# Patient Record
Sex: Female | Born: 1960 | Race: White | Hispanic: No | Smoking: Former smoker
Health system: Southern US, Community
[De-identification: ages and names within clinical notes are randomized; demographics above are authoritative.]

## PROBLEM LIST (undated history)

## (undated) DIAGNOSIS — K219 Gastro-esophageal reflux disease without esophagitis: Secondary | ICD-10-CM

## (undated) DIAGNOSIS — T4145XA Adverse effect of unspecified anesthetic, initial encounter: Secondary | ICD-10-CM

## (undated) DIAGNOSIS — Z9889 Other specified postprocedural states: Secondary | ICD-10-CM

## (undated) DIAGNOSIS — I2510 Atherosclerotic heart disease of native coronary artery without angina pectoris: Secondary | ICD-10-CM

## (undated) DIAGNOSIS — T8859XA Other complications of anesthesia, initial encounter: Secondary | ICD-10-CM

## (undated) DIAGNOSIS — R112 Nausea with vomiting, unspecified: Secondary | ICD-10-CM

## (undated) DIAGNOSIS — E781 Pure hyperglyceridemia: Secondary | ICD-10-CM

## (undated) HISTORY — PX: CHOLECYSTECTOMY: SHX55

## (undated) HISTORY — DX: Atherosclerotic heart disease of native coronary artery without angina pectoris: I25.10

## (undated) HISTORY — DX: Pure hyperglyceridemia: E78.1

## (undated) HISTORY — PX: TONSILLECTOMY: SUR1361

## (undated) HISTORY — PX: PLACEMENT OF BREAST IMPLANTS: SHX6334

---

## 1983-10-23 HISTORY — PX: WRIST FUSION: SHX839

## 1996-10-22 HISTORY — PX: GASTRIC BYPASS: SHX52

## 1999-10-23 HISTORY — PX: MANDIBLE FRACTURE SURGERY: SHX706

## 2003-10-23 HISTORY — PX: ABDOMINOPLASTY/PANNICULECTOMY: SHX5578

## 2018-05-01 ENCOUNTER — Ambulatory Visit: Payer: Self-pay

## 2018-05-01 ENCOUNTER — Other Ambulatory Visit: Payer: Self-pay | Admitting: Occupational Medicine

## 2018-05-01 DIAGNOSIS — M545 Low back pain: Secondary | ICD-10-CM

## 2019-02-25 NOTE — H&P (Signed)
TOTAL HIP ADMISSION H&P  Patient is admitted for right total hip arthroplasty, anterior approach.  Subjective:  Chief Complaint:   Right hip primary OA / pain  HPI: Jade Strickland, 58 y.o. female, has a history of pain and functional disability in the right hip(s) due to arthritis and patient has failed non-surgical conservative treatments for greater than 12 weeks to include NSAID's and/or analgesics and activity modification.  Onset of symptoms was abrupt starting ~1 year ago with gradually worsening course since that time.The patient noted no past surgery on the right hip(s).  Patient currently rates pain in the right hip at 8 out of 10 with activity. Patient has night pain, worsening of pain with activity and weight bearing, trendelenberg gait, pain that interfers with activities of daily living and pain with passive range of motion. Patient has evidence of periarticular osteophytes and joint space narrowing by imaging studies. This condition presents safety issues increasing the risk of falls. There is no current active infection.  Risks, benefits and expectations were discussed with the patient.  Risks including but not limited to the risk of anesthesia, blood clots, nerve damage, blood vessel damage, failure of the prosthesis, infection and up to and including death.  Patient understand the risks, benefits and expectations and wishes to proceed with surgery.   D/C Plans:       Home   Post-op Meds:       No Rx given  Tranexamic Acid:      To be given - IV   Decadron:      Is to be given  FYI:      ASA  Oxycodone (can't tolerate Norco)  No celebrex (sulfa allergy)  DME:   Pt already has equipment / Rx given for - RW   PT:    No PT    Past Medical History:  Diagnosis Date  . Complication of anesthesia   . PONV (postoperative nausea and vomiting)     Past Surgical History:  Procedure Laterality Date  . ABDOMINOPLASTY/PANNICULECTOMY  2005  . CHOLECYSTECTOMY    . GASTRIC BYPASS   1998  . MANDIBLE FRACTURE SURGERY  2001   upper  . TONSILLECTOMY    . WRIST FUSION Left 1985    No current facility-administered medications for this encounter.    Current Outpatient Medications  Medication Sig Dispense Refill Last Dose  . ibuprofen (ADVIL) 200 MG tablet Take 800 mg by mouth daily as needed for headache or moderate pain.     Marland Kitchen. lansoprazole (PREVACID) 15 MG capsule Take 15 mg by mouth daily at 12 noon.     . methocarbamol (ROBAXIN) 500 MG tablet Take 500 mg by mouth every 8 (eight) hours as needed for muscle spasms.     Marland Kitchen. oxymetazoline (AFRIN) 0.05 % nasal spray Place 1 spray into both nostrils 2 (two) times daily as needed for congestion.     . Simethicone (GAS-X PO) Take 1 tablet by mouth daily as needed (gas).     . traMADol (ULTRAM) 50 MG tablet Take 50 mg by mouth every 12 (twelve) hours as needed for severe pain.      Allergies  Allergen Reactions  . Hydrocodone Nausea And Vomiting  . Sulfa Antibiotics Hives    Social History   Tobacco Use  . Smoking status: Never Smoker  . Smokeless tobacco: Never Used  Substance Use Topics  . Alcohol use: Yes    Comment: Rarely      Review of Systems  Constitutional:  Negative.   HENT: Negative.   Eyes: Negative.   Respiratory: Negative.   Cardiovascular: Negative.   Gastrointestinal: Negative.   Genitourinary: Negative.   Musculoskeletal: Positive for joint pain.  Skin: Negative.   Neurological: Negative.   Endo/Heme/Allergies: Negative.   Psychiatric/Behavioral: Negative.     Objective:  Physical Exam  Constitutional: She is oriented to person, place, and time. She appears well-developed.  HENT:  Head: Normocephalic.  Eyes: Pupils are equal, round, and reactive to light.  Neck: Neck supple. No JVD present. No tracheal deviation present. No thyromegaly present.  Cardiovascular: Normal rate, regular rhythm and intact distal pulses.  Respiratory: Effort normal and breath sounds normal. No respiratory  distress. She has no wheezes.  GI: Soft. There is no abdominal tenderness. There is no guarding.  Musculoskeletal:     Right hip: She exhibits decreased range of motion, decreased strength, tenderness and bony tenderness. She exhibits no swelling, no deformity and no laceration.  Lymphadenopathy:    She has no cervical adenopathy.  Neurological: She is alert and oriented to person, place, and time.  Skin: Skin is warm and dry.  Psychiatric: She has a normal mood and affect.      Imaging Review Plain radiographs demonstrate severe degenerative joint disease of the right hip. The bone quality appears to be good for age and reported activity level.    Assessment/Plan:  End stage arthritis, right hip  The patient history, physical examination, clinical judgement of the provider and imaging studies are consistent with end stage degenerative joint disease of the right hip and total hip arthroplasty is deemed medically necessary. The treatment options including medical management, injection therapy, arthroscopy and arthroplasty were discussed at length. The risks and benefits of total hip arthroplasty were presented and reviewed. The risks due to aseptic loosening, infection, stiffness, dislocation/subluxation,  thromboembolic complications and other imponderables were discussed.  The patient acknowledged the explanation, agreed to proceed with the plan and consent was signed. Patient is being admitted for inpatient treatment for surgery, pain control, PT, OT, prophylactic antibiotics, VTE prophylaxis, progressive ambulation and ADL's and discharge planning.The patient is planning to be discharged home.     Anastasio Auerbach Braydyn Schultes   PA-C  03/02/2019, 7:27 AM

## 2019-02-26 NOTE — Progress Notes (Signed)
SPOKE W/PT     SCREENING SYMPTOMS OF COVID 19:   COUGH--NO  RUNNY NOSE--- NO  SORE THROAT---NO  NASAL CONGESTION----NO  SNEEZING----NO  SHORTNESS OF BREATH---NO  DIFFICULTY BREATHING---NO  TEMP >100.0 -----NO  UNEXPLAINED BODY ACHES------NO  CHILLS --------NO  HEADACHES ---------NO  LOSS OF SMELL/ TASTE --------NO    HAVE YOU OR ANY FAMILY MEMBER TRAVELLED PAST 14 DAYS OUT OF THE   COUNTY---NO STATE----NO COUNTRY----NO  HAVE YOU OR ANY FAMILY MEMBER BEEN EXPOSED TO ANYONE WITH COVID 19? NO    

## 2019-02-26 NOTE — Progress Notes (Signed)
LVM for COVID 19 Screening... Awaiting return call.

## 2019-02-26 NOTE — Patient Instructions (Addendum)
Jade Strickland  02/26/2019   Your procedure is scheduled on: 03-03-19    Report to [facility] Main  Entrance    Report to Admitting at 6:25 AM    Call this number if you have problems the morning of surgery [phone]    Remember: NO SOLID FOOD AFTER MIDNIGHT THE NIGHT PRIOR TO SURGERY. NOTHING BY MOUTH EXCEPT CLEAR LIQUIDS UNTIL 3 HOURS PRIOR TO SCHEUDLED SURGERY. PLEASE FINISH ENSURE DRINK PER SURGEON ORDER, WHICH NEEDS TO BE COMPLETED AT 4:30 AM.   CLEAR LIQUID DIET   Foods Allowed                                                                     Foods Excluded  Coffee and tea, regular and decaf                             liquids that you cannot  Plain Jell-O in any flavor                                             see through such as: Fruit ices (not with fruit pulp)                                     milk, soups, orange juice  Iced Popsicles                                    All solid food Carbonated beverages, regular and diet                                    Cranberry, grape and apple juices Sports drinks like Gatorade Lightly seasoned clear broth or consume(fat free) Sugar, honey syrup  Sample Menu Breakfast                                Lunch                                     Supper Cranberry juice                    Beef broth                            Chicken broth Jell-O                                     Grape juice  Apple juice Coffee or tea                        Jell-O                                      Popsicle                                                Coffee or tea                        Coffee or tea  _____________________________________________________________________     BRUSH YOUR TEETH MORNING OF SURGERY AND RINSE YOUR MOUTH OUT, NO CHEWING GUM CANDY OR MINTS.     Take these medicines the morning of surgery with A SIP OF WATER: None. You may bring and use your nasal spray as  needed.                                 You may not have any metal on your body including hair pins and              piercings  Do not wear jewelry, make-up, lotions, powders or perfumes, deodorant             Do not wear nail polish.  Do not shave  48 hours prior to surgery.                 Do not bring valuables to the hospital. [facility] IS NOT             RESPONSIBLE   FOR VALUABLES.  Contacts, dentures or bridgework may not be worn into surgery.      Special Instructions: N/A              Please read over the following fact sheets you were given: _____________________________________________________________________             [facility] Pre[facility] Before surgery, you can play an important role.  Because skin is not sterile, your skin needs to be as free of germs as possible.  You can reduce the number of germs on your skin by washing with CHG (chlorahexidine gluconate) soap before surgery.  CHG is an antiseptic cleaner which kills germs and bonds with the skin to continue killing germs even after washing. Please DO NOT use if you have an allergy to CHG or antibacterial soaps.  If your skin becomes reddened/irritated stop using the CHG and inform your nurse when you arrive at Short Stay. Do not shave (including legs and underarms) for at least 48 hours prior to the first CHG shower.  You may shave your face/neck. Please follow these instructions carefully:  1.  Shower with CHG Soap the night before surgery and the  morning of Surgery.  2.  If you choose to wash your hair, wash your hair first as usual with your  normal  shampoo.  3.  After you shampoo, rinse your hair and body thoroughly to remove the  shampoo.  4.  Use CHG as you would any other liquid soap.  You can apply chg directly  to the skin and wash                       Gently with a scrungie or clean washcloth.  5.  Apply the CHG Soap to your body ONLY FROM THE NECK DOWN.   Do not  use on face/ open                           Wound or open sores. Avoid contact with eyes, ears mouth and genitals (private parts).                       Wash face,  Genitals (private parts) with your normal soap.             6.  Wash thoroughly, paying special attention to the area where your surgery  will be performed.  7.  Thoroughly rinse your body with warm water from the neck down.  8.  DO NOT shower/wash with your normal soap after using and rinsing off  the CHG Soap.                9.  Pat yourself dry with a clean towel.            10.  Wear clean pajamas.            11.  Place clean sheets on your bed the night of your first shower and do not  sleep with pets. Day of Surgery : Do not apply any lotions/deodorants the morning of surgery.  Please wear clean clothes to the hospital/surgery center.  FAILURE TO FOLLOW THESE INSTRUCTIONS MAY RESULT IN THE CANCELLATION OF YOUR SURGERY PATIENT SIGNATURE_________________________________  NURSE SIGNATURE__________________________________  ________________________________________________________________________   Jade Strickland  An incentive spirometer is a tool that can help keep your lungs clear and active. This tool measures how well you are filling your lungs with each breath. Taking long deep breaths may help reverse or decrease the chance of developing breathing (pulmonary) problems (especially infection) following:  A long period of time when you are unable to move or be active. BEFORE THE PROCEDURE   If the spirometer includes an indicator to show your best effort, your nurse or respiratory therapist will set it to a desired goal.  If possible, sit up straight or lean slightly forward. Try not to slouch.  Hold the incentive spirometer in an upright position. INSTRUCTIONS FOR USE  1. Sit on the edge of your bed if possible, or sit up as far as you can in bed or on a chair. 2. Hold the incentive spirometer in an upright  position. 3. Breathe out normally. 4. Place the mouthpiece in your mouth and seal your lips tightly around it. 5. Breathe in slowly and as deeply as possible, raising the piston or the ball toward the top of the column. 6. Hold your breath for 3-5 seconds or for as long as possible. Allow the piston or ball to fall to the bottom of the column. 7. Remove the mouthpiece from your mouth and breathe out normally. 8. Rest for a few seconds and repeat Steps 1 through 7 at least 10 times every 1-2 hours when you are awake. Take your time and take a few normal breaths between deep breaths. 9. The spirometer may include an indicator to  show your best effort. Use the indicator as a goal to work toward during each repetition. 10. After each set of 10 deep breaths, practice coughing to be sure your lungs are clear. If you have an incision (the cut made at the time of surgery), support your incision when coughing by placing a pillow or rolled up towels firmly against it. Once you are able to get out of bed, walk around indoors and cough well. You may stop using the incentive spirometer when instructed by your caregiver.  RISKS AND COMPLICATIONS  Take your time so you do not get dizzy or light-headed.  If you are in pain, you may need to take or ask for pain medication before doing incentive spirometry. It is harder to take a deep breath if you are having pain. AFTER USE  Rest and breathe slowly and easily.  It can be helpful to keep track of a log of your progress. Your caregiver can provide you with a simple table to help with this. If you are using the spirometer at home, follow these instructions: [place] IF:   You are having difficultly using the spirometer.  You have trouble using the spirometer as often as instructed.  Your pain medication is not giving enough relief while using the spirometer.  You develop fever of 100.5 F (38.1 C) or higher. SEEK IMMEDIATE MEDICAL CARE IF:    You cough up bloody sputum that had not been present before.  You develop fever of 102 F (38.9 C) or greater.  You develop worsening pain at or near the incision site. MAKE SURE YOU:   Understand these instructions.  Will watch your condition.  Will get help right away if you are not doing well or get worse. Document Released: 02/18/2007 Document Revised: 12/31/2011 Document Reviewed: 04/21/2007 Exit[employer] Patient Information 2014 [employer], [place].   ________________________________________________________________________  WHAT IS A BLOOD TRANSFUSION? Blood Transfusion Information  A transfusion is the replacement of blood or some of its parts. Blood is made up of multiple cells which provide different functions.  Red blood cells carry oxygen and are used for blood loss replacement.  White blood cells fight against infection.  Platelets control bleeding.  Plasma helps clot blood.  Other blood products are available for specialized needs, such as hemophilia or other clotting disorders. BEFORE THE TRANSFUSION  Who gives blood for transfusions?   Healthy volunteers who are fully evaluated to make sure their blood is safe. This is blood bank blood. Transfusion therapy is the safest it has ever been in the practice of medicine. Before blood is taken from a donor, a complete history is taken to make sure that person has no history of diseases nor engages in risky social behavior (examples are intravenous drug use or sexual activity with multiple partners). The donor's travel history is screened to minimize risk of transmitting infections, such as malaria. The donated blood is tested for signs of infectious diseases, such as HIV and hepatitis. The blood is then tested to be sure it is compatible with you in order to minimize the chance of a transfusion reaction. If you or a relative donates blood, this is often done in anticipation of surgery and is not appropriate for emergency  situations. It takes many days to process the donated blood. RISKS AND COMPLICATIONS Although transfusion therapy is very safe and saves many lives, the main dangers of transfusion include:   Getting an infectious disease.  Developing a transfusion reaction. This is an allergic reaction  to something in the blood you were given. Every precaution is taken to prevent this. The decision to have a blood transfusion has been considered carefully by your caregiver before blood is given. Blood is not given unless the benefits outweigh the risks. AFTER THE TRANSFUSION  Right after receiving a blood transfusion, you will usually feel much better and more energetic. This is especially true if your red blood cells have gotten low (anemic). The transfusion raises the level of the red blood cells which carry oxygen, and this usually causes an energy increase.  The nurse administering the transfusion will monitor you carefully for complications. HOME CARE INSTRUCTIONS  No special instructions are needed after a transfusion. You may find your energy is better. Speak with your caregiver about any limitations on activity for underlying diseases you may have. SEEK MEDICAL CARE IF:   Your condition is not improving after your transfusion.  You develop redness or irritation at the intravenous (IV) site. SEEK IMMEDIATE MEDICAL CARE IF:  Any of the following symptoms occur over the next 12 hours:  Shaking chills.  You have a temperature by mouth above 102 F (38.9 C), not controlled by medicine.  Chest, back, or muscle pain.  People around you feel you are not acting correctly or are confused.  Shortness of breath or difficulty breathing.  Dizziness and fainting.  You get a rash or develop hives.  You have a decrease in urine output.  Your urine turns a dark color or changes to pink, red, or brown. Any of the following symptoms occur over the next 10 days:  You have a temperature by mouth above  102 F (38.9 C), not controlled by medicine.  Shortness of breath.  Weakness after normal activity.  The white part of the eye turns yellow (jaundice).  You have a decrease in the amount of urine or are urinating less often.  Your urine turns a dark color or changes to pink, red, or brown. Document Released: 10/05/2000 Document Revised: 12/31/2011 Document Reviewed: 05/24/2008 [facility] Patient Information 2014 [place], [place].  _______________________________________________________________________

## 2019-02-27 ENCOUNTER — Other Ambulatory Visit: Payer: Self-pay

## 2019-02-27 ENCOUNTER — Other Ambulatory Visit (HOSPITAL_COMMUNITY)
Admission: RE | Admit: 2019-02-27 | Discharge: 2019-02-27 | Disposition: A | Discharge status: Home / Self Care | Payer: No Typology Code available for payment source | Source: Ambulatory Visit | Attending: Orthopedic Surgery | Admitting: Orthopedic Surgery

## 2019-02-27 ENCOUNTER — Encounter (HOSPITAL_COMMUNITY)
Admission: RE | Admit: 2019-02-27 | Discharge: 2019-02-27 | Disposition: A | Discharge status: Home / Self Care | Payer: No Typology Code available for payment source | Source: Ambulatory Visit | Attending: Orthopedic Surgery | Admitting: Orthopedic Surgery

## 2019-02-27 ENCOUNTER — Encounter (HOSPITAL_COMMUNITY): Payer: Self-pay

## 2019-02-27 DIAGNOSIS — M1611 Unilateral primary osteoarthritis, right hip: Secondary | ICD-10-CM | POA: Insufficient documentation

## 2019-02-27 DIAGNOSIS — Z01812 Encounter for preprocedural laboratory examination: Secondary | ICD-10-CM | POA: Insufficient documentation

## 2019-02-27 DIAGNOSIS — Z1159 Encounter for screening for other viral diseases: Secondary | ICD-10-CM | POA: Insufficient documentation

## 2019-02-27 HISTORY — DX: Adverse effect of unspecified anesthetic, initial encounter: T41.45XA

## 2019-02-27 HISTORY — DX: Other specified postprocedural states: R11.2

## 2019-02-27 HISTORY — DX: Other specified postprocedural states: Z98.890

## 2019-02-27 HISTORY — DX: Other complications of anesthesia, initial encounter: T88.59XA

## 2019-02-27 LAB — BASIC METABOLIC PANEL
Anion gap: 8 (ref 5–15)
BUN: 18 mg/dL (ref 6–20)
CO2: 25 mmol/L (ref 22–32)
Calcium: 9.1 mg/dL (ref 8.9–10.3)
Chloride: 106 mmol/L (ref 98–111)
Creatinine, Ser: 0.66 mg/dL (ref 0.44–1.00)
GFR calc Af Amer: >60 mL/min (ref >60)
GFR calc non Af Amer: >60 mL/min (ref >60)
Glucose, Bld: 97 mg/dL (ref 70–99)
Potassium: 4.1 mmol/L (ref 3.5–5.1)
Sodium: 139 mmol/L (ref 135–145)

## 2019-02-27 LAB — CBC
HCT: 40.3 % (ref 36.0–46.0)
Hemoglobin: 12.5 g/dL (ref 12.0–15.0)
MCH: 28.7 pg (ref 26.0–34.0)
MCHC: 31 g/dL (ref 30.0–36.0)
MCV: 92.6 fL (ref 80.0–100.0)
Platelets: 258 10*3/uL (ref 150–400)
RBC: 4.35 MIL/uL (ref 3.87–5.11)
RDW: 12.8 % (ref 11.5–15.5)
WBC: 6.3 10*3/uL (ref 4.0–10.5)
nRBC: 0 % (ref 0.0–0.2)

## 2019-02-27 LAB — SURGICAL PCR SCREEN
MRSA, PCR: NEGATIVE
Staphylococcus aureus: POSITIVE — AB

## 2019-02-28 LAB — ABO/RH: ABO/RH(D): B POS

## 2019-03-01 LAB — NOVEL CORONAVIRUS, NAA (HOSP ORDER, SEND-OUT TO REF LAB; TAT 18-24 HRS): SARS-CoV-2, NAA: NOT DETECTED

## 2019-03-02 ENCOUNTER — Other Ambulatory Visit: Payer: Self-pay

## 2019-03-02 NOTE — Progress Notes (Signed)
Anesthesia Chart Review   Case:  [id number] Date/Time:  03/03/19 0840   Procedure:  TOTAL HIP ARTHROPLASTY ANTERIOR APPROACH (Right ) - 70 mins   Anesthesia type:  Spinal   Pre-op diagnosis:  Right hip osteoarthritis   Location:  [facility] ROOM 10 / [facility]S   Surgeon:  Durene Romans, MD      DISCUSSION: 58 yo PONV, right hip OA scheduled for above procedure 03/03/19 with Dr. Durene Romans.   Negative COVID19 swab 02/27/19.   Pt can proceed with planned procedure barring acute status change.  VS: BP 138/76   Pulse 75   Temp 36.6 C (Oral)   Resp 16   Ht 5\' 4"  (1.626 m)   Wt 68.8 kg   LMP 02/27/2008   SpO2 99%   BMI 26.04 kg/m   PROVIDERS: Patient, No Pcp Per   LABS: Labs reviewed: Acceptable for surgery. (all labs ordered are listed, but only abnormal results are displayed)  Labs Reviewed  SURGICAL PCR SCREEN - Abnormal; Notable for the following components:      Result Value   Staphylococcus aureus POSITIVE (*)    All other components within normal limits  BASIC METABOLIC PANEL  CBC  TYPE AND SCREEN  ABO/RH     IMAGES:   EKG:   CV:  Past Medical History:  Diagnosis Date  . Complication of anesthesia   . PONV (postoperative nausea and vomiting)     Past Surgical History:  Procedure Laterality Date  . ABDOMINOPLASTY/PANNICULECTOMY  2005  . CHOLECYSTECTOMY    . GASTRIC BYPASS  1998  . MANDIBLE FRACTURE SURGERY  2001   upper  . TONSILLECTOMY    . WRIST FUSION Left 1985    MEDICATIONS: . ibuprofen (ADVIL) 200 MG tablet  . lansoprazole (PREVACID) 15 MG capsule  . methocarbamol (ROBAXIN) 500 MG tablet  . oxymetazoline (AFRIN) 0.05 % nasal spray  . Simethicone (GAS-X PO)  . traMADol (ULTRAM) 50 MG tablet   No current facility-administered medications for this encounter.     Janey Genta [facility] Pre-Surgical Testing [phone] 03/02/19 1:29 PM

## 2019-03-02 NOTE — Progress Notes (Signed)
02-27-19 PCR result routed to Dr. Charlann Boxer for review

## 2019-03-02 NOTE — Progress Notes (Signed)
SPOKE W/  Jade Strickland     SCREENING SYMPTOMS OF COVID 19:   COUGH--no  RUNNY NOSE--- no  SORE THROAT---no  NASAL CONGESTION----no  SNEEZING----no  SHORTNESS OF BREATH---no  DIFFICULTY BREATHING---no  TEMP >100.0 -----no  UNEXPLAINED BODY ACHES------no  CHILLS -------- no  HEADACHES ---------no  LOSS OF SMELL/ TASTE --------no    HAVE YOU OR ANY FAMILY MEMBER TRAVELLED PAST 14 DAYS OUT OF THE   COUNTY---no STATE----no COUNTRY----no  HAVE YOU OR ANY FAMILY MEMBER BEEN EXPOSED TO ANYONE WITH COVID 19? no

## 2019-03-03 ENCOUNTER — Inpatient Hospital Stay (HOSPITAL_COMMUNITY): Payer: Self-pay | Admitting: Physician Assistant

## 2019-03-03 ENCOUNTER — Encounter (HOSPITAL_COMMUNITY): Payer: Self-pay | Admitting: Registered Nurse

## 2019-03-03 ENCOUNTER — Inpatient Hospital Stay (HOSPITAL_COMMUNITY): Payer: Self-pay

## 2019-03-03 ENCOUNTER — Observation Stay (HOSPITAL_COMMUNITY): Payer: Self-pay

## 2019-03-03 ENCOUNTER — Inpatient Hospital Stay (HOSPITAL_COMMUNITY): Payer: Self-pay | Admitting: Anesthesiology

## 2019-03-03 ENCOUNTER — Observation Stay (HOSPITAL_COMMUNITY)
Admission: RE | Admit: 2019-03-03 | Discharge: 2019-03-04 | Disposition: A | Discharge status: Home / Self Care | Payer: Self-pay | Attending: Orthopedic Surgery | Admitting: Orthopedic Surgery

## 2019-03-03 ENCOUNTER — Encounter (HOSPITAL_COMMUNITY)
Admission: RE | Disposition: A | Discharge status: Home / Self Care | Payer: Self-pay | Source: Home / Self Care | Attending: Orthopedic Surgery

## 2019-03-03 DIAGNOSIS — Z79899 Other long term (current) drug therapy: Secondary | ICD-10-CM | POA: Insufficient documentation

## 2019-03-03 DIAGNOSIS — Z9884 Bariatric surgery status: Secondary | ICD-10-CM | POA: Insufficient documentation

## 2019-03-03 DIAGNOSIS — Z96649 Presence of unspecified artificial hip joint: Secondary | ICD-10-CM

## 2019-03-03 DIAGNOSIS — Z96641 Presence of right artificial hip joint: Secondary | ICD-10-CM

## 2019-03-03 DIAGNOSIS — Z419 Encounter for procedure for purposes other than remedying health state, unspecified: Secondary | ICD-10-CM

## 2019-03-03 DIAGNOSIS — M160 Bilateral primary osteoarthritis of hip: Principal | ICD-10-CM | POA: Insufficient documentation

## 2019-03-03 HISTORY — PX: TOTAL HIP ARTHROPLASTY: SHX124

## 2019-03-03 LAB — TYPE AND SCREEN
ABO/RH(D): B POS
Antibody Screen: NEGATIVE

## 2019-03-03 SURGERY — ARTHROPLASTY, HIP, TOTAL, ANTERIOR APPROACH
Anesthesia: Spinal | Site: Hip

## 2019-03-03 MED ORDER — LIDOCAINE HCL (PF) 1 % IJ SOLN
INTRAMUSCULAR | Status: AC
  Filled 2019-03-03: qty 30

## 2019-03-03 MED ORDER — PANTOPRAZOLE SODIUM 20 MG PO TBEC
20.0000 mg | DELAYED_RELEASE_TABLET | Freq: Every day | ORAL | Status: DC
  Administered 2019-03-03: 20 mg via ORAL
  Filled 2019-03-03 (×2): qty 1

## 2019-03-03 MED ORDER — MEPERIDINE HCL 50 MG/ML IJ SOLN: 6.2500 mg | INTRAMUSCULAR | Status: DC | PRN

## 2019-03-03 MED ORDER — MIDAZOLAM HCL 2 MG/2ML IJ SOLN
INTRAMUSCULAR | Status: AC
  Filled 2019-03-03: qty 2

## 2019-03-03 MED ORDER — LACTATED RINGERS IV SOLN
INTRAVENOUS | Status: DC
  Administered 2019-03-03 (×3): via INTRAVENOUS

## 2019-03-03 MED ORDER — METOCLOPRAMIDE HCL 5 MG/ML IJ SOLN: 5.0000 mg | Freq: Three times a day (TID) | INTRAMUSCULAR | Status: DC | PRN

## 2019-03-03 MED ORDER — POLYETHYLENE GLYCOL 3350 17 G PO PACK
17.0000 g | PACK | Freq: Two times a day (BID) | ORAL | Status: DC
  Administered 2019-03-03: 17 g via ORAL
  Filled 2019-03-03: qty 1

## 2019-03-03 MED ORDER — 0.9 % SODIUM CHLORIDE (POUR BTL) OPTIME
TOPICAL | Status: DC | PRN
  Administered 2019-03-03: 1000 mL

## 2019-03-03 MED ORDER — LACTATED RINGERS IV SOLN: INTRAVENOUS | Status: DC

## 2019-03-03 MED ORDER — METHOCARBAMOL 500 MG PO TABS: 500.0000 mg | ORAL_TABLET | Freq: Four times a day (QID) | ORAL | 0 refills | Status: DC | PRN

## 2019-03-03 MED ORDER — CEFAZOLIN SODIUM-DEXTROSE 2-4 GM/100ML-% IV SOLN
2.0000 g | INTRAVENOUS | Status: AC
  Administered 2019-03-03: 2 g via INTRAVENOUS
  Filled 2019-03-03: qty 100

## 2019-03-03 MED ORDER — MAGNESIUM CITRATE PO SOLN: 1.0000 | Freq: Once | ORAL | Status: DC | PRN

## 2019-03-03 MED ORDER — METHOCARBAMOL 500 MG PO TABS
500.0000 mg | ORAL_TABLET | Freq: Four times a day (QID) | ORAL | Status: DC | PRN
  Administered 2019-03-03 – 2019-03-04 (×2): 500 mg via ORAL
  Filled 2019-03-03 (×2): qty 1

## 2019-03-03 MED ORDER — ACETAMINOPHEN 500 MG PO TABS: 1000.0000 mg | ORAL_TABLET | Freq: Three times a day (TID) | ORAL | 0 refills | Status: DC

## 2019-03-03 MED ORDER — METHYLPREDNISOLONE ACETATE 40 MG/ML IJ SUSP
INTRAMUSCULAR | Status: AC
  Filled 2019-03-03: qty 2

## 2019-03-03 MED ORDER — CEFAZOLIN SODIUM-DEXTROSE 2-4 GM/100ML-% IV SOLN
2.0000 g | Freq: Four times a day (QID) | INTRAVENOUS | Status: AC
  Administered 2019-03-03 (×2): 2 g via INTRAVENOUS
  Filled 2019-03-03 (×2): qty 100

## 2019-03-03 MED ORDER — SODIUM CHLORIDE 0.9 % IV SOLN
INTRAVENOUS | Status: DC
  Administered 2019-03-03 – 2019-03-04 (×2): via INTRAVENOUS

## 2019-03-03 MED ORDER — PROPOFOL 500 MG/50ML IV EMUL
INTRAVENOUS | Status: DC | PRN
  Administered 2019-03-03: 100 ug/kg/min via INTRAVENOUS
  Administered 2019-03-03: 75 ug/kg/min via INTRAVENOUS

## 2019-03-03 MED ORDER — SCOPOLAMINE 1 MG/3DAYS TD PT72
MEDICATED_PATCH | TRANSDERMAL | Status: AC
  Filled 2019-03-03: qty 1

## 2019-03-03 MED ORDER — DEXAMETHASONE SODIUM PHOSPHATE 10 MG/ML IJ SOLN
INTRAMUSCULAR | Status: AC
  Filled 2019-03-03: qty 1

## 2019-03-03 MED ORDER — PHENOL 1.4 % MT LIQD
1.0000 | OROMUCOSAL | Status: DC | PRN
  Filled 2019-03-03: qty 177

## 2019-03-03 MED ORDER — DIPHENHYDRAMINE HCL 12.5 MG/5ML PO ELIX: 12.5000 mg | ORAL_SOLUTION | ORAL | Status: DC | PRN

## 2019-03-03 MED ORDER — PROPOFOL 10 MG/ML IV BOLUS
INTRAVENOUS | Status: DC | PRN
  Administered 2019-03-03: 20 mg via INTRAVENOUS
  Administered 2019-03-03: 50 mg via INTRAVENOUS

## 2019-03-03 MED ORDER — FENTANYL CITRATE (PF) 100 MCG/2ML IJ SOLN: 25.0000 ug | INTRAMUSCULAR | Status: DC | PRN

## 2019-03-03 MED ORDER — POLYETHYLENE GLYCOL 3350 17 G PO PACK: 17.0000 g | PACK | Freq: Two times a day (BID) | ORAL | 0 refills | Status: DC

## 2019-03-03 MED ORDER — FENTANYL CITRATE (PF) 100 MCG/2ML IJ SOLN
INTRAMUSCULAR | Status: AC
  Filled 2019-03-03: qty 2

## 2019-03-03 MED ORDER — ONDANSETRON HCL 4 MG/2ML IJ SOLN
INTRAMUSCULAR | Status: DC | PRN
  Administered 2019-03-03: 4 mg via INTRAVENOUS

## 2019-03-03 MED ORDER — FERROUS SULFATE 325 (65 FE) MG PO TABS
325.0000 mg | ORAL_TABLET | Freq: Three times a day (TID) | ORAL | Status: DC
  Administered 2019-03-03 – 2019-03-04 (×3): 325 mg via ORAL
  Filled 2019-03-03 (×3): qty 1

## 2019-03-03 MED ORDER — OXYCODONE HCL 5 MG PO TABS: 5.0000 mg | ORAL_TABLET | ORAL | Status: DC | PRN

## 2019-03-03 MED ORDER — HYDROMORPHONE HCL 1 MG/ML IJ SOLN
0.5000 mg | INTRAMUSCULAR | Status: DC | PRN
  Administered 2019-03-03 – 2019-03-04 (×4): 1 mg via INTRAVENOUS
  Filled 2019-03-03 (×4): qty 1

## 2019-03-03 MED ORDER — ONDANSETRON HCL 4 MG PO TABS: 4.0000 mg | ORAL_TABLET | Freq: Four times a day (QID) | ORAL | Status: DC | PRN

## 2019-03-03 MED ORDER — PROPOFOL 10 MG/ML IV BOLUS
INTRAVENOUS | Status: AC
  Filled 2019-03-03: qty 20

## 2019-03-03 MED ORDER — BISACODYL 10 MG RE SUPP: 10.0000 mg | Freq: Every day | RECTAL | Status: DC | PRN

## 2019-03-03 MED ORDER — ONDANSETRON HCL 4 MG/2ML IJ SOLN: 4.0000 mg | Freq: Four times a day (QID) | INTRAMUSCULAR | Status: DC | PRN

## 2019-03-03 MED ORDER — METHOCARBAMOL 500 MG IVPB - SIMPLE MED
500.0000 mg | Freq: Four times a day (QID) | INTRAVENOUS | Status: DC | PRN
  Filled 2019-03-03: qty 50

## 2019-03-03 MED ORDER — DEXAMETHASONE SODIUM PHOSPHATE 10 MG/ML IJ SOLN
10.0000 mg | Freq: Once | INTRAMUSCULAR | Status: AC
  Administered 2019-03-04: 10 mg via INTRAVENOUS
  Filled 2019-03-03: qty 1

## 2019-03-03 MED ORDER — METHYLPREDNISOLONE ACETATE 40 MG/ML IJ SUSP
INTRAMUSCULAR | Status: DC | PRN
  Administered 2019-03-03: 80 mg

## 2019-03-03 MED ORDER — ASPIRIN 81 MG PO CHEW: 81.0000 mg | CHEWABLE_TABLET | Freq: Two times a day (BID) | ORAL | 0 refills | Status: AC

## 2019-03-03 MED ORDER — FERROUS SULFATE 325 (65 FE) MG PO TABS: 325.0000 mg | ORAL_TABLET | Freq: Three times a day (TID) | ORAL | 3 refills | Status: DC

## 2019-03-03 MED ORDER — FENTANYL CITRATE (PF) 100 MCG/2ML IJ SOLN
INTRAMUSCULAR | Status: DC | PRN
  Administered 2019-03-03: 100 ug via INTRAVENOUS

## 2019-03-03 MED ORDER — DOCUSATE SODIUM 100 MG PO CAPS
100.0000 mg | ORAL_CAPSULE | Freq: Two times a day (BID) | ORAL | Status: DC
  Administered 2019-03-03 – 2019-03-04 (×2): 100 mg via ORAL
  Filled 2019-03-03 (×2): qty 1

## 2019-03-03 MED ORDER — OXYCODONE HCL 5 MG PO TABS: 5.0000 mg | ORAL_TABLET | ORAL | 0 refills | Status: DC | PRN

## 2019-03-03 MED ORDER — ASPIRIN 81 MG PO CHEW
81.0000 mg | CHEWABLE_TABLET | Freq: Two times a day (BID) | ORAL | Status: DC
  Administered 2019-03-03 – 2019-03-04 (×2): 81 mg via ORAL
  Filled 2019-03-03 (×2): qty 1

## 2019-03-03 MED ORDER — ALUM & MAG HYDROXIDE-SIMETH 200-200-20 MG/5ML PO SUSP: 15.0000 mL | ORAL | Status: DC | PRN

## 2019-03-03 MED ORDER — BUPIVACAINE IN DEXTROSE 0.75-8.25 % IT SOLN
INTRATHECAL | Status: DC | PRN
  Administered 2019-03-03: 1.6 mL via INTRATHECAL

## 2019-03-03 MED ORDER — ONDANSETRON HCL 4 MG/2ML IJ SOLN
INTRAMUSCULAR | Status: AC
  Filled 2019-03-03: qty 2

## 2019-03-03 MED ORDER — OXYCODONE HCL 5 MG PO TABS
10.0000 mg | ORAL_TABLET | ORAL | Status: DC | PRN
  Administered 2019-03-03 – 2019-03-04 (×6): 10 mg via ORAL
  Filled 2019-03-03 (×6): qty 2

## 2019-03-03 MED ORDER — PROPOFOL 10 MG/ML IV BOLUS
INTRAVENOUS | Status: AC
  Filled 2019-03-03: qty 60

## 2019-03-03 MED ORDER — DOCUSATE SODIUM 100 MG PO CAPS: 100.0000 mg | ORAL_CAPSULE | Freq: Two times a day (BID) | ORAL | 0 refills | Status: DC

## 2019-03-03 MED ORDER — SCOPOLAMINE 1 MG/3DAYS TD PT72
MEDICATED_PATCH | TRANSDERMAL | Status: DC | PRN
  Administered 2019-03-03: 1 via TRANSDERMAL

## 2019-03-03 MED ORDER — TRANEXAMIC ACID-NACL 1000-0.7 MG/100ML-% IV SOLN
1000.0000 mg | INTRAVENOUS | Status: AC
  Administered 2019-03-03: 1000 mg via INTRAVENOUS
  Filled 2019-03-03: qty 100

## 2019-03-03 MED ORDER — MENTHOL 3 MG MT LOZG: 1.0000 | LOZENGE | OROMUCOSAL | Status: DC | PRN

## 2019-03-03 MED ORDER — METOCLOPRAMIDE HCL 5 MG PO TABS: 5.0000 mg | ORAL_TABLET | Freq: Three times a day (TID) | ORAL | Status: DC | PRN

## 2019-03-03 MED ORDER — CHLORHEXIDINE GLUCONATE 4 % EX LIQD: 60.0000 mL | Freq: Once | CUTANEOUS | Status: DC

## 2019-03-03 MED ORDER — LIDOCAINE HCL (PF) 1 % IJ SOLN
INTRAMUSCULAR | Status: DC | PRN
  Administered 2019-03-03: 2 mL

## 2019-03-03 MED ORDER — OXYMETAZOLINE HCL 0.05 % NA SOLN
1.0000 | Freq: Two times a day (BID) | NASAL | Status: DC | PRN
  Filled 2019-03-03: qty 15

## 2019-03-03 MED ORDER — SODIUM CHLORIDE 0.9 % IV SOLN
INTRAVENOUS | Status: DC | PRN
  Administered 2019-03-03: 35 ug/min via INTRAVENOUS

## 2019-03-03 MED ORDER — ACETAMINOPHEN 500 MG PO TABS
1000.0000 mg | ORAL_TABLET | Freq: Four times a day (QID) | ORAL | Status: AC
  Administered 2019-03-03 – 2019-03-04 (×4): 1000 mg via ORAL
  Filled 2019-03-03 (×4): qty 2

## 2019-03-03 MED ORDER — TRANEXAMIC ACID-NACL 1000-0.7 MG/100ML-% IV SOLN
1000.0000 mg | Freq: Once | INTRAVENOUS | Status: AC
  Administered 2019-03-03: 1000 mg via INTRAVENOUS
  Filled 2019-03-03: qty 100

## 2019-03-03 MED ORDER — MIDAZOLAM HCL 5 MG/5ML IJ SOLN
INTRAMUSCULAR | Status: DC | PRN
  Administered 2019-03-03: 2 mg via INTRAVENOUS

## 2019-03-03 MED ORDER — METOCLOPRAMIDE HCL 5 MG/ML IJ SOLN: 10.0000 mg | Freq: Once | INTRAMUSCULAR | Status: DC | PRN

## 2019-03-03 MED ORDER — DEXAMETHASONE SODIUM PHOSPHATE 10 MG/ML IJ SOLN
10.0000 mg | Freq: Once | INTRAMUSCULAR | Status: AC
  Administered 2019-03-03: 10 mg via INTRAVENOUS

## 2019-03-03 SURGICAL SUPPLY — 46 items
BAG DECANTER FOR FLEXI CONT (MISCELLANEOUS) IMPLANT
BAG ZIPLOCK 12X15 (MISCELLANEOUS) ×3 IMPLANT
BLADE SAG 18X100X1.27 (BLADE) ×3 IMPLANT
BLADE SURG SZ10 CARB STEEL (BLADE) ×6 IMPLANT
BNDG COHESIVE 4X5 WHT NS (GAUZE/BANDAGES/DRESSINGS) ×3 IMPLANT
CHLORAPREP W/TINT 26 (MISCELLANEOUS) ×3 IMPLANT
COVER PERINEAL POST (MISCELLANEOUS) ×3 IMPLANT
COVER SURGICAL LIGHT HANDLE (MISCELLANEOUS) ×3 IMPLANT
COVER WAND RF STERILE (DRAPES) IMPLANT
CUP ACET PINNACLE SECTR 50MM (Hips) ×1 IMPLANT
DERMABOND ADVANCED (GAUZE/BANDAGES/DRESSINGS) ×2
DERMABOND ADVANCED .7 DNX12 (GAUZE/BANDAGES/DRESSINGS) ×1 IMPLANT
DRAPE STERI IOBAN 125X83 (DRAPES) ×3 IMPLANT
DRAPE U-SHAPE 47X51 STRL (DRAPES) ×6 IMPLANT
DRESSING AQUACEL AG SP 3.5X10 (GAUZE/BANDAGES/DRESSINGS) ×1 IMPLANT
DRSG AQUACEL AG SP 3.5X10 (GAUZE/BANDAGES/DRESSINGS) ×3
ELECT BLADE TIP CTD 4 INCH (ELECTRODE) ×3 IMPLANT
ELECT REM PT RETURN 15FT ADLT (MISCELLANEOUS) ×3 IMPLANT
ELIMINATOR HOLE APEX DEPUY (Hips) ×3 IMPLANT
GLOVE BIOGEL PI IND STRL 7.5 (GLOVE) ×1 IMPLANT
GLOVE BIOGEL PI IND STRL 8.5 (GLOVE) ×1 IMPLANT
GLOVE BIOGEL PI INDICATOR 7.5 (GLOVE) ×2
GLOVE BIOGEL PI INDICATOR 8.5 (GLOVE) ×2
GLOVE ECLIPSE 8.0 STRL XLNG CF (GLOVE) ×6 IMPLANT
GLOVE ORTHO TXT STRL SZ7.5 (GLOVE) ×3 IMPLANT
GOWN STRL REUS W/TWL 2XL LVL3 (GOWN DISPOSABLE) ×3 IMPLANT
GOWN STRL REUS W/TWL LRG LVL3 (GOWN DISPOSABLE) ×3 IMPLANT
HEAD FEMORAL 32 CERAMIC (Hips) ×3 IMPLANT
HOLDER FOLEY CATH W/STRAP (MISCELLANEOUS) ×3 IMPLANT
KIT TURNOVER KIT A (KITS) ×3 IMPLANT
LINER ACET PNNCL PLUS4 NEUTRAL (Hips) ×1 IMPLANT
PACK ANTERIOR HIP CUSTOM (KITS) ×3 IMPLANT
PINNACLE PLUS 4 NEUTRAL (Hips) ×3 IMPLANT
PINNACLE SECTOR CUP 50MM (Hips) ×3 IMPLANT
SCREW 6.5MMX25MM (Screw) ×3 IMPLANT
STEM FEM ACTIS HIGH SZ7 (Stem) ×3 IMPLANT
SUT MNCRL AB 4-0 PS2 18 (SUTURE) ×3 IMPLANT
SUT STRATAFIX 0 PDS 27 VIOLET (SUTURE) ×3
SUT VIC AB 1 CT1 36 (SUTURE) ×9 IMPLANT
SUT VIC AB 2-0 CT1 27 (SUTURE) ×4
SUT VIC AB 2-0 CT1 TAPERPNT 27 (SUTURE) ×2 IMPLANT
SUTURE STRATFX 0 PDS 27 VIOLET (SUTURE) ×1 IMPLANT
TRAY FOLEY MTR SLVR 14FR STAT (SET/KITS/TRAYS/PACK) ×3 IMPLANT
TRAY FOLEY MTR SLVR 16FR STAT (SET/KITS/TRAYS/PACK) ×6 IMPLANT
WATER STERILE IRR 1000ML POUR (IV SOLUTION) ×6 IMPLANT
YANKAUER SUCT BULB TIP 10FT TU (MISCELLANEOUS) IMPLANT

## 2019-03-03 NOTE — Discharge Instructions (Signed)

## 2019-03-03 NOTE — Interval H&P Note (Signed)
History and Physical Interval Note:  03/03/2019 6:59 AM  Jade Strickland  has presented today for surgery, with the diagnosis of Right hip osteoarthritis.  The various methods of treatment have been discussed with the patient and family. After consideration of risks, benefits and other options for treatment, the patient has consented to  Procedure(s) with comments: TOTAL HIP ARTHROPLASTY ANTERIOR APPROACH (Right) - 70 mins as a surgical intervention.  The patient's history has been reviewed, patient examined, no change in status, stable for surgery.  I have reviewed the patient's chart and labs.  Questions were answered to the patient's satisfaction.     Shelda Pal

## 2019-03-03 NOTE — Anesthesia Procedure Notes (Signed)
Spinal  Patient location during procedure: OR End time: 03/03/2019 9:13 AM Staffing Anesthesiologist: Carignan, Peter, MD Performed: anesthesiologist  Preanesthetic Checklist Completed: patient identified, site marked, surgical consent, pre-op evaluation, timeout performed, IV checked, risks and benefits discussed and monitors and equipment checked Spinal Block Patient position: sitting Prep: DuraPrep Patient monitoring: heart rate, continuous pulse ox and blood pressure Approach: right paramedian Location: L3-4 Injection technique: single-shot Needle Needle type: Pencan  Needle gauge: 24 G Needle length: 9 cm Additional Notes Expiration date of kit checked and confirmed. Patient tolerated procedure well, without complications.       

## 2019-03-03 NOTE — Evaluation (Signed)
Physical Therapy Evaluation Patient Details Name: Stevan BornCheryl Golobish MRN: [id number] DOB: Oct 08, 1961 Today's Date: 03/03/2019   History of Present Illness  s/p R DA THA, L hip injection.  Clinical Impression  Pt is s/p THA resulting in the deficits listed below (see PT Problem List).  Pt anxious regarding mobility, reported some pain relief after amb, more comfortable in recliner. Will see again in am--pt has 1 small step to enter her home, discussed up/down backwards.  Pt will benefit from skilled PT to increase their independence and safety with mobility to allow discharge to the venue listed below.      Follow Up Recommendations Follow surgeon's recommendation for DC plan and follow-up therapies(HEP)    Equipment Recommendations  3in1 (PT)(pt requests and states she will have to pay for it)    Recommendations for Other Services       Precautions / Restrictions Restrictions Weight Bearing Restrictions: No Other Position/Activity Restrictions: WBAT      Mobility  Bed Mobility Overal bed mobility: Needs Assistance Bed Mobility: Supine to Sit     Supine to sit: Min assist;Min guard     General bed mobility comments: cues for technique, pt anxious regarding movement  Transfers Overall transfer level: Needs assistance Equipment used: Rolling walker (2 wheeled) Transfers: Sit to/from Stand Sit to Stand: Min assist;Min guard         General transfer comment: cues for hand placement and RLE position  Ambulation/Gait Ambulation/Gait assistance: Min guard;Min assist Gait Distance (Feet): 50 Feet Assistive device: Rolling walker (2 wheeled) Gait Pattern/deviations: Step-to pattern;Antalgic     General Gait Details: cues for sequence and RW position  Stairs            Wheelchair Mobility    Modified Rankin (Stroke Patients Only)       Balance                                             Pertinent Vitals/Pain Pain Assessment:  0-10 Pain Score: 4  Pain Location: right hip Pain Descriptors / Indicators: Discomfort;Grimacing;Guarding Pain Intervention(s): Limited activity within patient's tolerance;Monitored during session    Home Living Family/patient expects to be discharged to:: Private residence Living Arrangements: Parent(Dad) Available Help at Discharge: Family Type of Home: House(townhouse) Home Access: Stairs to enter   Secretary/administratorntrance Stairs-Number of Steps: 1 Home Layout: Two level;1/2 bath on main level(plans to stay downstairs for 1-2wks) Home Equipment: Dan HumphreysWalker - 2 wheels      Prior Function Level of Independence: Independent               Hand Dominance        Extremity/Trunk Assessment   Upper Extremity Assessment Upper Extremity Assessment: Overall WFL for tasks assessed    Lower Extremity Assessment Lower Extremity Assessment: RLE deficits/detail RLE Deficits / Details: limited by post op pain; ankle grossly WFL; hip and knee grossly 2/5       Communication   Communication: No difficulties  Cognition Arousal/Alertness: Awake/alert Behavior During Therapy: WFL for tasks assessed/performed Overall Cognitive Status: Within Functional Limits for tasks assessed                                        General Comments      Exercises Total Joint Exercises Ankle Circles/Pumps:  AROM;Both;10 reps   Assessment/Plan    PT Assessment Patient needs continued PT services  PT Problem List Decreased strength;Decreased range of motion;Decreased mobility;Decreased activity tolerance;Decreased knowledge of use of DME;Pain       PT Treatment Interventions Stair training;Gait training;DME instruction;Functional mobility training;Patient/family education;Therapeutic activities;Therapeutic exercise    PT Goals (Current goals can be found in the Care Plan section)  Acute Rehab PT Goals Patient Stated Goal: home PT Goal Formulation: With patient Time For Goal Achievement:  03/10/19 Potential to Achieve Goals: Good    Frequency 7X/week   Barriers to discharge        Co-evaluation               AM-PAC PT "6 Clicks" Mobility  Outcome Measure Help needed turning from your back to your side while in a flat bed without using bedrails?: A Little Help needed moving from lying on your back to sitting on the side of a flat bed without using bedrails?: A Little Help needed moving to and from a bed to a chair (including a wheelchair)?: A Little Help needed standing up from a chair using your arms (e.g., wheelchair or bedside chair)?: A Little Help needed to walk in hospital room?: A Little Help needed climbing 3-5 steps with a railing? : A Little 6 Click Score: 18    End of Session Equipment Utilized During Treatment: Gait belt Activity Tolerance: Patient tolerated treatment well Patient left: with call bell/phone within reach;in chair;with chair alarm set   PT Visit Diagnosis: Difficulty in walking, not elsewhere classified (R26.2)    Time: 4196-2229 PT Time Calculation (min) (ACUTE ONLY): 24 min   Charges:   PT Evaluation $PT Eval Low Complexity: 1 Low PT Treatments $Gait Training: 8-22 mins        Drucilla Chalet, PT  Pager: [phone] Acute Rehab Dept [facility]): [phone]   03/03/2019   [facility] 03/03/2019, 4:05 PM

## 2019-03-03 NOTE — Op Note (Signed)
NAME:  Jade Strickland                ACCOUNT NO.: [id number]      MEDICAL RECORD NO.: [id number]      [facility]      PHYSICIAN:  Shelda PalMatthew D Deyton Ellenbecker  DATE OF BIRTH:  05-14-1961     DATE OF PROCEDURE:  03/03/2019                                 OPERATIVE REPORT         PREOPERATIVE DIAGNOSIS: Right  hip osteoarthritis. 2. Left hip osteoarthritis     POSTOPERATIVE DIAGNOSIS:  Right hip osteoarthritis. 2. Left hip osteoarthritis     PROCEDURE:  Right total hip replacement through an anterior approach   utilizing DePuy THR system, component size 50mm pinnacle cup, a size 32+4 neutral   Altrex liner, a size 7 Hi Actis stem with a 32+1 delta ceramic   ball.   2. Left hip intra-articular injection     SURGEON:  Madlyn FrankelMatthew D. Charlann Boxerlin, M.D.      ASSISTANT:  Lanney GinsMatthew Babish, PA-C     ANESTHESIA:  Spinal.      SPECIMENS:  None.      COMPLICATIONS:  None.      BLOOD LOSS:  250 cc     DRAINS:  None.      INDICATION OF THE PROCEDURE:  Jade Strickland is a 58 y.o. female who had   presented to office for evaluation of bilateral hip pain.  Radiographs revealed   progressive degenerative changes with bone-on-bone   articulation of the  hip joint, including subchondral cystic changes and osteophytes.  The patient had painful limited range of   motion significantly affecting their overall quality of life and function.  The patient was failing to    respond to conservative measures including medications and/or injections and activity modification and at this point was ready   to proceed with more definitive measures.  Consent was obtained for   benefit of pain relief.  Specific risks of infection, DVT, component   failure, dislocation, neurovascular injury, and need for revision surgery were reviewed in the office as well discussion of   the anterior versus posterior approach were reviewed.     PROCEDURE IN DETAIL:  The patient was brought to operative theater.   Once adequate anesthesia, preoperative antibiotics, 2 gm of Ancef, 1 gm of Tranexamic Acid, and 10 mg of Decadron were administered, the patient was positioned supine on the [employer] table.  Once the patient was safely positioned with adequate padding of boney prominences we predraped out the hip, and used fluoroscopy to confirm orientation of the pelvis.   Prior to proceeding with the right total hip we first performed a time out to perform left intra-articular injection under fluoroscopy.  Once the time out was completed I oriented myself to her hip landmarks.  Fluoroscopy confirmed.  I then prepped the area with betadine.  The left hip was then injected with 80mg  of Depomedrol and Lidocaine.  Once completed and covered with Bandaid we directed attention to her right hip.        The right hip was then prepped and draped from proximal iliac crest to   mid thigh with a shower curtain technique.      Time-out was performed identifying the patient, planned procedure, and the appropriate extremity.  An incision was then made 2 cm lateral to the   anterior superior iliac spine extending over the orientation of the   tensor fascia lata muscle and sharp dissection was carried down to the   fascia of the muscle.      The fascia was then incised.  The muscle belly was identified and swept   laterally and retractor placed along the superior neck.  Following   cauterization of the circumflex vessels and removing some pericapsular   fat, a second cobra retractor was placed on the inferior neck.  A T-capsulotomy was made along the line of the   superior neck to the trochanteric fossa, then extended proximally and   distally.  Tag sutures were placed and the retractors were then placed   intracapsular.  We then identified the trochanteric fossa and   orientation of my neck cut and then made a neck osteotomy with the femur on traction.  The femoral   head was removed without difficulty or complication.   Traction was let   off and retractors were placed posterior and anterior around the   acetabulum.      The labrum and foveal tissue were debrided.  I began reaming with a 45 mm   reamer and reamed up to 49 mm reamer with good bony bed preparation and a 50 mm  cup was chosen.  The final 50 mm Pinnacle cup was then impacted under fluoroscopy to confirm the depth of penetration and orientation with respect to   Abduction and forward flexion.  A screw was placed into the ilium followed by the hole eliminator.  The final   32+4 neutral Altrex liner was impacted with good visualized rim fit.  The cup was positioned anatomically within the acetabular portion of the pelvis.      At this point, the femur was rolled to 100 degrees.  Further capsule was   released off the inferior aspect of the femoral neck.  I then   released the superior capsule proximally.  With the leg in a neutral position the hook was placed laterally   along the femur under the vastus lateralis origin and elevated manually and then held in position using the hook attachment on the bed.  The leg was then extended and adducted with the leg rolled to 100   degrees of external rotation.  Retractors were placed along the medial calcar and posteriorly over the greater trochanter.  Once the proximal femur was fully   exposed, I used a box osteotome to set orientation.  I then began   broaching with the starting chili pepper broach and passed this by hand and then broached up to 7.  With the 7 broach in place I chose a high offset neck and did several trial reductions.  The offset was appropriate, leg lengths   appeared to be equal best matched with the +1 head ball trial confirmed radiographically.   Given these findings, I went ahead and dislocated the hip, repositioned all   retractors and positioned the right hip in the extended and abducted position.  The final 7 Hi Actis stem was   chosen and it was impacted down to the level of neck  cut.  Based on this   and the trial reductions, a final 32+1 delta ceramic ball was chosen and   impacted onto a clean and dry trunnion, and the hip was reduced.  The   hip had been irrigated throughout the case again at this  point.  I did   reapproximate the superior capsular leaflet to the anterior leaflet   using #1 Vicryl.  The fascia of the   tensor fascia lata muscle was then reapproximated using #1 Vicryl and #0 Stratafix sutures.  The   remaining wound was closed with 2-0 Vicryl and running 4-0 Monocryl.   The hip was cleaned, dried, and dressed sterilely using Dermabond and   Aquacel dressing.  The patient was then brought   to recovery room in stable condition tolerating the procedure well.    Lanney Gins, PA-C was present for the entirety of the case involved from   preoperative positioning, perioperative retractor management, general   facilitation of the case, as well as primary wound closure as assistant.            Madlyn Frankel Charlann Boxer, M.D.        03/03/2019 10:23 AM

## 2019-03-03 NOTE — Transfer of Care (Signed)
Immediate Anesthesia Transfer of Care Note  Patient: Jade Strickland  Procedure(s) Performed: TOTAL HIP ARTHROPLASTY ANTERIOR APPROACH WITH LEFT HIP INTRAARTICULAR INJECTION (N/A Hip)  Patient Location: PACU  Anesthesia Type:Spinal  Level of Consciousness: awake, alert , oriented and patient cooperative  Airway & Oxygen Therapy: Patient Spontanous Breathing and Patient connected to face mask oxygen  Post-op Assessment: Report given to RN  Post vital signs: stable  Last Vitals:  Vitals Value Taken Time  BP 115/50 03/03/2019 10:45 AM  Temp    Pulse 63 03/03/2019 10:46 AM  Resp 15 03/03/2019 10:46 AM  SpO2 100 % 03/03/2019 10:46 AM  Vitals shown include unvalidated device data.  Last Pain:  Vitals:   03/03/19 0644  TempSrc: Oral         Complications: No apparent anesthesia complications

## 2019-03-03 NOTE — Anesthesia Procedure Notes (Addendum)
Procedure Name: MAC Date/Time: 03/03/2019 8:57 AM Performed by: Lissa Morales, CRNA Pre-anesthesia Checklist: Patient identified, Emergency Drugs available, Suction available, Patient being monitored and Timeout performed Patient Re-evaluated:Patient Re-evaluated prior to induction Oxygen Delivery Method: Simple face mask Placement Confirmation: positive ETCO2 Dental Injury: Teeth and Oropharynx as per pre-operative assessment

## 2019-03-03 NOTE — Anesthesia Postprocedure Evaluation (Signed)
Anesthesia Post Note  Patient: Jade Strickland  Procedure(s) Performed: TOTAL HIP ARTHROPLASTY ANTERIOR APPROACH WITH LEFT HIP INTRAARTICULAR INJECTION (N/A Hip)     Patient location during evaluation: PACU Anesthesia Type: Spinal Level of consciousness: awake and alert Pain management: pain level controlled Vital Signs Assessment: post-procedure vital signs reviewed and stable Respiratory status: spontaneous breathing and respiratory function stable Cardiovascular status: blood pressure returned to baseline and stable Postop Assessment: no headache, no backache, spinal receding and no apparent nausea or vomiting Anesthetic complications: no    Last Vitals:  Vitals:   03/03/19 1130 03/03/19 1218  BP: 114/67 122/65  Pulse: 64 67  Resp: 14 16  Temp: (!) 36.4 C 36.6 C  SpO2: 97% 100%    Last Pain:  Vitals:   03/03/19 1218  TempSrc: Oral  PainSc:                  Phillips Grout

## 2019-03-03 NOTE — Anesthesia Preprocedure Evaluation (Signed)
Anesthesia Evaluation  Patient identified by MRN, date of birth, ID band Patient awake    Reviewed: Allergy & Precautions, NPO status , Patient's Chart, lab work & pertinent test results  History of Anesthesia Complications (+) PONV  Airway Mallampati: II  TM Distance: >3 FB Neck ROM: Full    Dental no notable dental hx.    Pulmonary neg pulmonary ROS,    Pulmonary exam normal breath sounds clear to auscultation       Cardiovascular negative cardio ROS Normal cardiovascular exam Rhythm:Regular Rate:Normal     Neuro/Psych negative neurological ROS  negative psych ROS   GI/Hepatic negative GI ROS, Neg liver ROS,   Endo/Other  negative endocrine ROS  Renal/GU negative Renal ROS  negative genitourinary   Musculoskeletal negative musculoskeletal ROS (+)   Abdominal   Peds negative pediatric ROS (+)  Hematology negative hematology ROS (+)   Anesthesia Other Findings   Reproductive/Obstetrics negative OB ROS                             Anesthesia Physical Anesthesia Plan  ASA: II  Anesthesia Plan: Spinal   Post-op Pain Management:    Induction: Intravenous  PONV Risk Score and Plan: 3 and Ondansetron and Treatment may vary due to age or medical condition  Airway Management Planned: Simple Face Mask  Additional Equipment:   Intra-op Plan:   Post-operative Plan:   Informed Consent: I have reviewed the patients History and Physical, chart, labs and discussed the procedure including the risks, benefits and alternatives for the proposed anesthesia with the patient or authorized representative who has indicated his/her understanding and acceptance.     Dental advisory given  Plan Discussed with: CRNA  Anesthesia Plan Comments:         Anesthesia Quick Evaluation

## 2019-03-04 ENCOUNTER — Encounter (HOSPITAL_COMMUNITY): Payer: Self-pay | Admitting: Orthopedic Surgery

## 2019-03-04 LAB — BASIC METABOLIC PANEL
Anion gap: 7 (ref 5–15)
BUN: 10 mg/dL (ref 6–20)
CO2: 24 mmol/L (ref 22–32)
Calcium: 8.8 mg/dL — ABNORMAL LOW (ref 8.9–10.3)
Chloride: 107 mmol/L (ref 98–111)
Creatinine, Ser: 0.5 mg/dL (ref 0.44–1.00)
GFR calc Af Amer: >60 mL/min (ref >60)
GFR calc non Af Amer: >60 mL/min (ref >60)
Glucose, Bld: 116 mg/dL — ABNORMAL HIGH (ref 70–99)
Potassium: 4.1 mmol/L (ref 3.5–5.1)
Sodium: 138 mmol/L (ref 135–145)

## 2019-03-04 LAB — CBC
HCT: 33 % — ABNORMAL LOW (ref 36.0–46.0)
Hemoglobin: 10.2 g/dL — ABNORMAL LOW (ref 12.0–15.0)
MCH: 29.1 pg (ref 26.0–34.0)
MCHC: 30.9 g/dL (ref 30.0–36.0)
MCV: 94 fL (ref 80.0–100.0)
Platelets: 206 10*3/uL (ref 150–400)
RBC: 3.51 MIL/uL — ABNORMAL LOW (ref 3.87–5.11)
RDW: 12.8 % (ref 11.5–15.5)
WBC: 9.8 10*3/uL (ref 4.0–10.5)
nRBC: 0 % (ref 0.0–0.2)

## 2019-03-04 NOTE — Plan of Care (Signed)
  Problem: Education: Goal: Knowledge of General Education information will improve Description Including pain rating scale, medication(s)/side effects and non-pharmacologic comfort measures Outcome: Adequate for Discharge   Problem: Health Behavior/Discharge Planning: Goal: Ability to manage health-related needs will improve Outcome: Adequate for Discharge   Problem: Clinical Measurements: Goal: Ability to maintain clinical measurements within normal limits will improve Outcome: Adequate for Discharge Goal: Will remain free from infection Outcome: Adequate for Discharge Goal: Diagnostic test results will improve Outcome: Adequate for Discharge Goal: Respiratory complications will improve Outcome: Adequate for Discharge Goal: Cardiovascular complication will be avoided Outcome: Adequate for Discharge   Problem: Activity: Goal: Risk for activity intolerance will decrease Outcome: Adequate for Discharge   Problem: Nutrition: Goal: Adequate nutrition will be maintained Outcome: Adequate for Discharge   Problem: Coping: Goal: Level of anxiety will decrease Outcome: Adequate for Discharge   Problem: Elimination: Goal: Will not experience complications related to bowel motility Outcome: Adequate for Discharge Goal: Will not experience complications related to urinary retention Outcome: Adequate for Discharge   Problem: Pain Managment: Goal: General experience of comfort will improve Outcome: Adequate for Discharge   Problem: Safety: Goal: Ability to remain free from injury will improve Outcome: Adequate for Discharge   Problem: Skin Integrity: Goal: Risk for impaired skin integrity will decrease Outcome: Adequate for Discharge   Pt was given the discharge instructions, discussion and written information. All questions answered.

## 2019-03-04 NOTE — Progress Notes (Signed)
   Subjective: 1 Day Post-Op Procedure(s) (LRB): TOTAL HIP ARTHROPLASTY ANTERIOR APPROACH WITH LEFT HIP INTRAARTICULAR INJECTION (N/A) Patient reports pain as moderate.   Patient seen in rounds for Dr. Charlann Boxer. Patient is well, and has had no acute complaints or problems other than pain in the right hip. No acute events overnight. Patient states she would like to go home today. Foley catheter removed.  We will continue therapy today.   Objective: Vital signs in last 24 hours: Temp:  [97.5 F (36.4 C)-99.4 F (37.4 C)] 97.8 F (36.6 C) (05/13 0421) Pulse Rate:  [63-92] 87 (05/13 0421) Resp:  [10-18] 18 (05/13 0421) BP: (107-139)/(50-87) 139/75 (05/13 0421) SpO2:  [96 %-100 %] 100 % (05/13 0421)  Intake/Output from previous day:  Intake/Output Summary (Last 24 hours) at 03/04/2019 1010 Last data filed at 03/04/2019 0900 Gross per 24 hour  Intake 3214.61 ml  Output 4675 ml  Net -1460.39 ml     Intake/Output this shift: Total I/O In: -  Out: 1300 [Urine:1300]  Labs: Recent Labs    03/04/19 0329  HGB 10.2*   Recent Labs    03/04/19 0329  WBC 9.8  RBC 3.51*  HCT 33.0*  PLT 206   Recent Labs    03/04/19 0329  NA 138  K 4.1  CL 107  CO2 24  BUN 10  CREATININE 0.50  GLUCOSE 116*  CALCIUM 8.8*   No results for input(s): LABPT, INR in the last 72 hours.  Exam: General - Patient is Alert and Oriented Extremity - Neurologically intact Sensation intact distally Intact pulses distally Dorsiflexion/Plantar flexion intact Dressing - dressing C/D/I Motor Function - intact, moving foot and toes well on exam.   Past Medical History:  Diagnosis Date  . Complication of anesthesia   . PONV (postoperative nausea and vomiting)     Assessment/Plan: 1 Day Post-Op Procedure(s) (LRB): TOTAL HIP ARTHROPLASTY ANTERIOR APPROACH WITH LEFT HIP INTRAARTICULAR INJECTION (N/A) Principal Problem:   S/P right THA, AA  Estimated body mass index is 26.04 kg/m as calculated from  the following:   Height as of 02/27/19: 5\' 4"  (1.626 m).   Weight as of 02/27/19: 68.8 kg. Advance diet Up with therapy D/C IV fluids  DVT Prophylaxis - Aspirin Weight bearing as tolerated. D/C O2 and pulse ox and try on room air.   Plan is to go Home after hospital stay. Plan for discharge today as long as she is meeting goals with physical therapy. She states she is needing a shower chair, so we have sent an order in to [facility] for this. She will follow up in 2 weeks in the office with Dr. Charlann Boxer.   Dennie Bible, PA-C Orthopedic Surgery 03/04/2019, 10:10 AM

## 2019-03-04 NOTE — Discharge Summary (Signed)
Physician Discharge Summary  Patient ID: Jade Strickland MRN: [id number] DOB/AGE: 1960/12/25 58 y.o.  Admit date: 03/03/2019 Discharge date: 03/04/2019   Procedures:  Procedure(s) (LRB): TOTAL HIP ARTHROPLASTY ANTERIOR APPROACH WITH LEFT HIP INTRAARTICULAR INJECTION (N/A)  Attending Physician:  Dr. Durene Romans   Admission Diagnoses:   Right hip primary OA / pain  Discharge Diagnoses:  Principal Problem:   S/P right THA, AA  Past Medical History:  Diagnosis Date  . Complication of anesthesia   . PONV (postoperative nausea and vomiting)     HPI:    Jade Strickland, 58 y.o. female, has a history of pain and functional disability in the right hip(s) due to arthritis and patient has failed non-surgical conservative treatments for greater than 12 weeks to include NSAID's and/or analgesics and activity modification.  Onset of symptoms was abrupt starting ~1 year ago with gradually worsening course since that time.The patient noted no past surgery on the right hip(s).  Patient currently rates pain in the right hip at 8 out of 10 with activity. Patient has night pain, worsening of pain with activity and weight bearing, trendelenberg gait, pain that interfers with activities of daily living and pain with passive range of motion. Patient has evidence of periarticular osteophytes and joint space narrowing by imaging studies. This condition presents safety issues increasing the risk of falls.There is no current active infection.  Risks, benefits and expectations were discussed with the patient.  Risks including but not limited to the risk of anesthesia, blood clots, nerve damage, blood vessel damage, failure of the prosthesis, infection and up to and including death.  Patient understand the risks, benefits and expectations and wishes to proceed with surgery.   PCP: Patient, No Pcp Per   Discharged Condition: good  Hospital Course:  Patient underwent the above stated procedure on 03/03/2019.  Patient tolerated the procedure well and brought to the recovery room in good condition and subsequently to the floor.  POD #1 BP: 139/75 ; Pulse: 87 ; Temp: 97.8 F (36.6 C) ; Resp: 18 Patient reports pain as moderate. Patient is well, and has had no acute complaints or problems other than pain in the right hip. No acute events overnight. Patient states she would like to go home today. Foley catheter removed.  We will continue therapy today.  Patient is Alert and Oriented,  Extremity - Neurologically intact, Sensation intact distally, Intact pulses distally and Dorsiflexion/Plantar flexion intact.  Dressing - dressing C/D/I.  Motor Function - intact, moving foot and toes well on exam.   LABS  Basename    HGB     10.2  HCT     33.0    Discharge Exam: General appearance: alert, cooperative and no distress Extremities: Homans sign is negative, no sign of DVT, no edema, redness or tenderness in the calves or thighs and no ulcers, gangrene or trophic changes  Disposition: Home  with follow up in 2 weeks   Follow-up Information    Durene Romans, MD. Schedule an appointment as soon as possible for a visit in 2 weeks.   Specialty:  Orthopedic Surgery Contact information: [address] [place] [address] [phone]           Discharge Instructions    Call MD / Call 911   Complete by:  As directed    If you experience chest pain or shortness of breath, CALL 911 and be transported to the hospital emergency room.  If you develope a fever above 101  F, pus (white drainage) or increased drainage or redness at the wound, or calf pain, call your surgeon's office.   Call MD / Call 911   Complete by:  As directed    If you experience chest pain or shortness of breath, CALL 911 and be transported to the hospital emergency room.  If you develope a fever above 101 F, pus (white drainage) or increased drainage or redness at the wound, or calf pain, call your surgeon's office.    Change dressing   Complete by:  As directed    Maintain surgical dressing until follow up in the clinic. If the edges start to pull up, may reinforce with tape. If the dressing is no longer working, may remove and cover with gauze and tape, but must keep the area dry and clean.  Call with any questions or concerns.   Change dressing   Complete by:  As directed    Maintain surgical dressing until follow up in the clinic. If the edges start to pull up, may reinforce with tape. If the dressing is no longer working, may remove and cover with gauze and tape, but must keep the area dry and clean.  Call with any questions or concerns.   Change dressing   Complete by:  As directed    Maintain surgical dressing until follow up in the clinic. If the edges start to pull up, may reinforce with tape. If the dressing is no longer working, may remove and cover with gauze and tape, but must keep the area dry and clean.  Call with any questions or concerns.   Constipation Prevention   Complete by:  As directed    Drink plenty of fluids.  Prune juice may be helpful.  You may use a stool softener, such as Colace (over the counter) 100 mg twice a day.  Use MiraLax (over the counter) for constipation as needed.   Constipation Prevention   Complete by:  As directed    Drink plenty of fluids.  Prune juice may be helpful.  You may use a stool softener, such as Colace (over the counter) 100 mg twice a day.  Use MiraLax (over the counter) for constipation as needed.   Diet - low sodium heart healthy   Complete by:  As directed    Diet - low sodium heart healthy   Complete by:  As directed    Discharge instructions   Complete by:  As directed    Maintain surgical dressing until follow up in the clinic. If the edges start to pull up, may reinforce with tape. If the dressing is no longer working, may remove and cover with gauze and tape, but must keep the area dry and clean.  [facility]. Call with any questions or concerns.   Discharge instructions   Complete by:  As directed    Maintain surgical dressing until follow up in the clinic. If the edges start to pull up, may reinforce with tape. If the dressing is no longer working, may remove and cover with gauze and tape, but must keep the area dry and clean.  [facility] 2 weeks [facility]. Call with any questions or concerns.   Increase activity slowly as tolerated   Complete by:  As directed    Weight bearing as tolerated with assist device (walker, cane, etc) as directed, use it as long as suggested by your surgeon or therapist, typically at least 4-6  weeks.   Increase activity slowly as tolerated   Complete by:  As directed    Weight bearing as tolerated with assist device (walker, cane, etc) as directed, use it as long as suggested by your surgeon or therapist, typically at least 4-6 weeks.   TED hose   Complete by:  As directed    Use stockings (TED hose) for 2 weeks on both leg(s).  You may remove them at night for sleeping.   TED hose   Complete by:  As directed    Use stockings (TED hose) for 2 weeks on both leg(s).  You may remove them at night for sleeping.   TED hose   Complete by:  As directed    Use stockings (TED hose) for 2 weeks on both leg(s).  You may remove them at night for sleeping.      Allergies as of 03/04/2019      Reactions   Hydrocodone Nausea And Vomiting   Sulfa Antibiotics Hives      Medication List    STOP taking these medications   ibuprofen 200 MG tablet Commonly known as:  ADVIL   traMADol 50 MG tablet Commonly known as:  ULTRAM     TAKE these medications   acetaminophen 500 MG tablet Commonly known as:  TYLENOL Take 2 tablets (1,000 mg total) by mouth every 8 (eight) hours.   aspirin 81 MG chewable tablet Commonly known as:  Aspirin Childrens Chew 1 tablet (81 mg total) by mouth 2 (two) times daily for 30 days. Take for 4 weeks, then resume  regular dose.   docusate sodium 100 MG capsule Commonly known as:  Colace Take 1 capsule (100 mg total) by mouth 2 (two) times daily.   ferrous sulfate 325 (65 FE) MG tablet Commonly known as:  FerrouSul Take 1 tablet (325 mg total) by mouth 3 (three) times daily with meals.   GAS-X PO Take 1 tablet by mouth daily as needed (gas).   lansoprazole 15 MG capsule Commonly known as:  PREVACID Take 15 mg by mouth daily at 12 noon.   methocarbamol 500 MG tablet Commonly known as:  Robaxin Take 1 tablet (500 mg total) by mouth every 6 (six) hours as needed for muscle spasms. What changed:  when to take this   oxyCODONE 5 MG immediate release tablet Commonly known as:  Oxy IR/ROXICODONE Take 1-2 tablets (5-10 mg total) by mouth every 4 (four) hours as needed for moderate pain or severe pain.   oxymetazoline 0.05 % nasal spray Commonly known as:  AFRIN Place 1 spray into both nostrils 2 (two) times daily as needed for congestion.   polyethylene glycol 17 g packet Commonly known as:  MIRALAX / GLYCOLAX Take 17 g by mouth 2 (two) times daily.            Discharge Care Instructions  (From admission, onward)         Start     Ordered   03/04/19 0000  Change dressing    Comments:  Maintain surgical dressing until follow up in the clinic. If the edges start to pull up, may reinforce with tape. If the dressing is no longer working, may remove and cover with gauze and tape, but must keep the area dry and clean.  Call with any questions or concerns.   03/04/19 0934   03/04/19 0000  Change dressing    Comments:  Maintain surgical dressing until follow up in the clinic. If the edges start  to pull up, may reinforce with tape. If the dressing is no longer working, may remove and cover with gauze and tape, but must keep the area dry and clean.  Call with any questions or concerns.   03/04/19 1013   03/04/19 0000  Change dressing    Comments:  Maintain surgical dressing until follow up in  the clinic. If the edges start to pull up, may reinforce with tape. If the dressing is no longer working, may remove and cover with gauze and tape, but must keep the area dry and clean.  Call with any questions or concerns.   03/04/19 1013           Signed: Anastasio Auerbach. Irvin Bastin   PA-C  03/04/2019, 1:43 PM

## 2019-03-04 NOTE — Progress Notes (Signed)
Physical Therapy Treatment Patient Details Name: Stevan BornCheryl Golobish MRN: [id number] DOB: [birth year] Today's Date: 03/04/2019    History of Present Illness s/p R DA THA, L hip injection.    PT Comments    Pt assisted to bathroom and then ambulated in hallway.  Verbally reviewed stair technique and pt able to repeat back.  Pt performed LE exercises and provided with HEP handout.  Pt had no further questions and feels ready for d/c home today.   Follow Up Recommendations  Follow surgeon's recommendation for DC plan and follow-up therapies(HEP)     Equipment Recommendations  3in1 (PT)    Recommendations for Other Services       Precautions / Restrictions Precautions Precautions: None Restrictions Other Position/Activity Restrictions: WBAT    Mobility  Bed Mobility Overal bed mobility: Needs Assistance Bed Mobility: Supine to Sit;Sit to Supine     Supine to sit: Supervision Sit to supine: Supervision   General bed mobility comments: verbal cues for self assist  Transfers Overall transfer level: Needs assistance Equipment used: Rolling walker (2 wheeled) Transfers: Sit to/from Stand Sit to Stand: Min guard;Supervision         General transfer comment: pt able to recall good positioning  Ambulation/Gait Ambulation/Gait assistance: Min guard Gait Distance (Feet): 80 Feet Assistive device: Rolling walker (2 wheeled) Gait Pattern/deviations: Step-to pattern;Antalgic;Decreased stance time - right     General Gait Details: cues for sequence and RW position   Stairs             Wheelchair Mobility    Modified Rankin (Stroke Patients Only)       Balance                                            Cognition Arousal/Alertness: Awake/alert Behavior During Therapy: WFL for tasks assessed/performed Overall Cognitive Status: Within Functional Limits for tasks assessed                                        Exercises  Total Joint Exercises Ankle Circles/Pumps: AROM;Both;10 reps Quad Sets: AROM;Both;10 reps Heel Slides: AROM;5 reps;Right Hip ABduction/ADduction: AROM;10 reps;Standing;Right Long Arc Quad: AROM;10 reps;Seated;Right Knee Flexion: AROM;10 reps;Standing;Right Marching in Standing: AROM;10 reps;Standing;Right Standing Hip Extension: AROM;10 reps;Right;Standing    General Comments        Pertinent Vitals/Pain Pain Assessment: 0-10 Pain Score: 6  Pain Location: right hip Pain Descriptors / Indicators: Discomfort;Grimacing;Guarding Pain Intervention(s): Repositioned;Monitored during session;RN gave pain meds during session;Ice applied    Home Living                      Prior Function            PT Goals (current goals can now be found in the care plan section) Progress towards PT goals: Progressing toward goals    Frequency    7X/week      PT Plan Current plan remains appropriate    Co-evaluation              AM-PAC PT "6 Clicks" Mobility   Outcome Measure  Help needed turning from your back to your side while in a flat bed without using bedrails?: A Little Help needed moving from lying on your back to sitting on the side of a  flat bed without using bedrails?: A Little Help needed moving to and from a bed to a chair (including a wheelchair)?: A Little Help needed standing up from a chair using your arms (e.g., wheelchair or bedside chair)?: A Little Help needed to walk in hospital room?: A Little Help needed climbing 3-5 steps with a railing? : A Little 6 Click Score: 18    End of Session Equipment Utilized During Treatment: Gait belt Activity Tolerance: Patient tolerated treatment well Patient left: with call bell/phone within reach;in bed Nurse Communication: Mobility status PT Visit Diagnosis: Difficulty in walking, not elsewhere classified (R26.2)     Time: 2620-3559 PT Time Calculation (min) (ACUTE ONLY): 38 min  Charges:  $Gait Training:  8-22 mins $Therapeutic Exercise: 8-22 mins                    Zenovia Jarred, PT, DPT Acute Rehabilitation Services Office: [phone] Pager: [phone]  Sarajane Jews 03/04/2019, 1:15 PM

## 2019-09-10 ENCOUNTER — Other Ambulatory Visit: Payer: Self-pay | Admitting: Family Medicine

## 2019-09-10 DIAGNOSIS — Z1231 Encounter for screening mammogram for malignant neoplasm of breast: Secondary | ICD-10-CM

## 2019-10-08 ENCOUNTER — Other Ambulatory Visit: Payer: Self-pay | Admitting: Physician Assistant

## 2019-10-08 ENCOUNTER — Other Ambulatory Visit (HOSPITAL_COMMUNITY)
Admission: RE | Admit: 2019-10-08 | Discharge: 2019-10-08 | Disposition: A | Discharge status: Home / Self Care | Payer: 59 | Source: Ambulatory Visit | Attending: Physician Assistant | Admitting: Physician Assistant

## 2019-10-08 DIAGNOSIS — Z124 Encounter for screening for malignant neoplasm of cervix: Secondary | ICD-10-CM | POA: Diagnosis present

## 2019-10-27 LAB — CYTOLOGY - PAP
Chlamydia: NEGATIVE
Comment: NEGATIVE
Comment: NEGATIVE
Comment: NEGATIVE
Comment: NEGATIVE
Comment: NORMAL
Diagnosis: NEGATIVE
HPV 16: NEGATIVE
HPV 18 / 45: NEGATIVE
High risk HPV: POSITIVE — AB
Neisseria Gonorrhea: NEGATIVE
Trichomonas: NEGATIVE

## 2019-11-04 ENCOUNTER — Ambulatory Visit: Payer: Self-pay

## 2019-11-04 ENCOUNTER — Ambulatory Visit: Payer: 59

## 2020-02-27 IMAGING — DX PORTABLE PELVIS 1-2 VIEWS
1 series · 1 of 1 positions shown · non-contrast
Comparison: Earlier same day

CLINICAL DATA: Right total hip replacement

EXAM:
PORTABLE PELVIS 1-2 VIEWS

[pelvis ap]
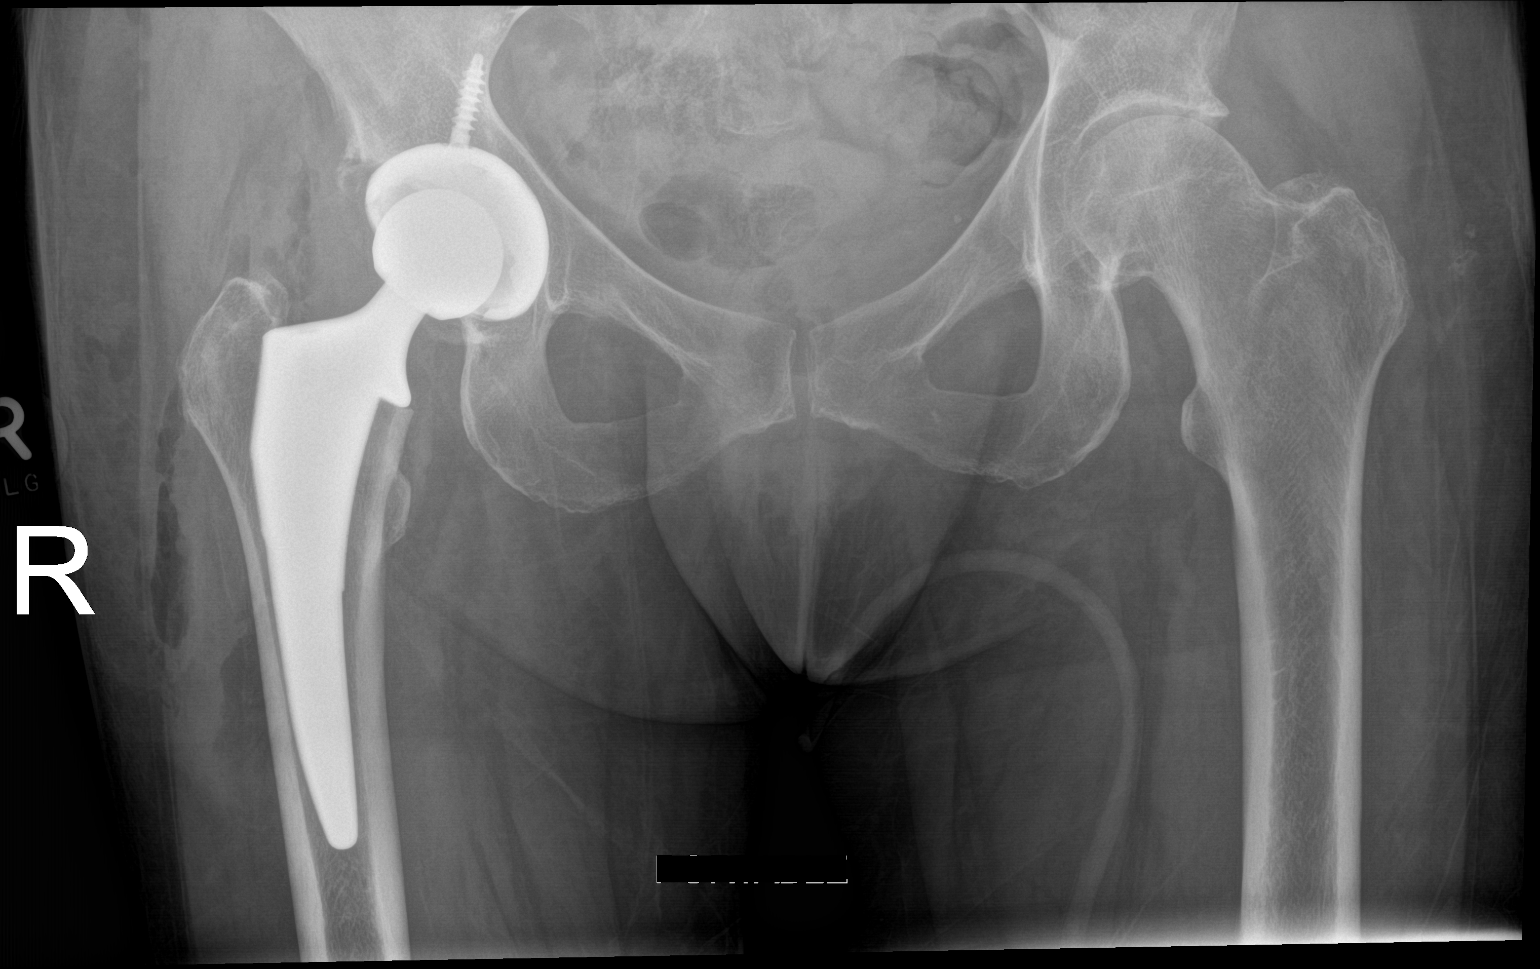

[1 of 1 positions shown; findings below may reference images not displayed]

FINDINGS: Total hip arthroplasty on the right has a good appearance.
Components appear well positioned. No radiographically detectable
complication.
IMPRESSION: Good appearance following total hip arthroplasty on the right

## 2020-07-26 ENCOUNTER — Encounter (HOSPITAL_COMMUNITY): Payer: Self-pay

## 2020-07-26 DIAGNOSIS — S0502XA Injury of conjunctiva and corneal abrasion without foreign body, left eye, initial encounter: Secondary | ICD-10-CM | POA: Diagnosis not present

## 2020-07-26 DIAGNOSIS — Y7711 Contact lens associated with adverse incidents: Secondary | ICD-10-CM | POA: Diagnosis not present

## 2020-07-26 DIAGNOSIS — S0592XA Unspecified injury of left eye and orbit, initial encounter: Secondary | ICD-10-CM | POA: Diagnosis present

## 2020-07-26 MED ORDER — FLUORESCEIN SODIUM 1 MG OP STRP
1.0000 | ORAL_STRIP | Freq: Once | OPHTHALMIC | Status: DC
  Filled 2020-07-26: qty 1

## 2020-07-26 MED ORDER — TETRACAINE HCL 0.5 % OP SOLN
1.0000 [drp] | Freq: Once | OPHTHALMIC | Status: AC
  Administered 2020-07-27: 1 [drp] via OPHTHALMIC
  Filled 2020-07-26: qty 4

## 2020-07-26 NOTE — ED Triage Notes (Signed)
Pt states that she has something in her left eye, she thinks it may be a contact that she didn't get out

## 2020-07-27 ENCOUNTER — Emergency Department (HOSPITAL_COMMUNITY)
Admission: EM | Admit: 2020-07-27 | Discharge: 2020-07-27 | Disposition: A | Discharge status: Home / Self Care | Payer: 59 | Attending: Emergency Medicine | Admitting: Emergency Medicine

## 2020-07-27 DIAGNOSIS — S0502XA Injury of conjunctiva and corneal abrasion without foreign body, left eye, initial encounter: Secondary | ICD-10-CM

## 2020-07-27 MED ORDER — CIPROFLOXACIN HCL 0.3 % OP SOLN: 1.0000 [drp] | OPHTHALMIC | 0 refills | Status: DC

## 2020-07-27 NOTE — ED Provider Notes (Addendum)
[facility]-EMERGENCY DEPT Provider Note   CSN: [id number] Arrival date & time: 07/26/20  2301     History No chief complaint on file.   Jade Strickland is a 59 y.o. female.  Patient presents to the emergency department with a chief complaint of left eye pain.  She states that it feels like there is a contact lens that is stuck in her eye.  She states that she did remove a contact lens earlier today, but thinks that there is still another one in there.  She is uncertain whether she forgot to take 1 out yesterday.  She complains of foreign body sensation along with pain.  She has had eye irritation and clear watery discharge from the eye.  She is allergic to sulfa.  She denies any fevers.  Denies vision loss.  The history is provided by the patient. No language interpreter was used.       Past Medical History:  Diagnosis Date  . Complication of anesthesia   . PONV (postoperative nausea and vomiting)     Patient Active Problem List   Diagnosis Date Noted  . S/P right THA, AA 03/03/2019    Past Surgical History:  Procedure Laterality Date  . ABDOMINOPLASTY/PANNICULECTOMY  2005  . CHOLECYSTECTOMY    . GASTRIC BYPASS  1998  . MANDIBLE FRACTURE SURGERY  2001   upper  . TONSILLECTOMY    . TOTAL HIP ARTHROPLASTY N/A 03/03/2019   Procedure: TOTAL HIP ARTHROPLASTY ANTERIOR APPROACH WITH LEFT HIP INTRAARTICULAR INJECTION;  Surgeon: Durene Romans, MD;  Location: [facility] ORS;  Service: Orthopedics;  Laterality: N/A;  70 mins  . WRIST FUSION Left 1985     OB History   No obstetric history on file.     History reviewed. No pertinent family history.  Social History   Tobacco Use  . Smoking status: Never Smoker  . Smokeless tobacco: Never Used  Vaping Use  . Vaping Use: Never used  Substance Use Topics  . Alcohol use: Yes    Comment: Rarely  . Drug use: Never    Home Medications Prior to Admission medications   Medication Sig Start Date End Date Taking?  Authorizing Provider  acetaminophen (TYLENOL) 500 MG tablet Take 2 tablets (1,000 mg total) by mouth every 8 (eight) hours. 03/03/19   Lanney Gins, PA-C  docusate sodium (COLACE) 100 MG capsule Take 1 capsule (100 mg total) by mouth 2 (two) times daily. 03/03/19   Lanney Gins, PA-C  ferrous sulfate (FERROUSUL) 325 (65 FE) MG tablet Take 1 tablet (325 mg total) by mouth 3 (three) times daily with meals. 03/03/19   Lanney Gins, PA-C  lansoprazole (PREVACID) 15 MG capsule Take 15 mg by mouth daily at 12 noon.    [provider]  methocarbamol (ROBAXIN) 500 MG tablet Take 1 tablet (500 mg total) by mouth every 6 (six) hours as needed for muscle spasms. 03/03/19   Lanney Gins, PA-C  oxyCODONE (OXY IR/ROXICODONE) 5 MG immediate release tablet Take 1-2 tablets (5-10 mg total) by mouth every 4 (four) hours as needed for moderate pain or severe pain. 03/03/19   Lanney Gins, PA-C  oxymetazoline (AFRIN) 0.05 % nasal spray Place 1 spray into both nostrils 2 (two) times daily as needed for congestion.    [provider]  polyethylene glycol (MIRALAX / GLYCOLAX) 17 g packet Take 17 g by mouth 2 (two) times daily. 03/03/19   Lanney Gins, PA-C  Simethicone (GAS-X PO) Take 1 tablet by mouth daily  as needed (gas).    [provider]    Allergies    Hydrocodone and Sulfa antibiotics  Review of Systems   Review of Systems  All other systems reviewed and are negative.   Physical Exam Updated Vital Signs BP (!) 139/95 (BP Location: Right Arm)   Pulse 84   Temp 98 F (36.7 C) (Oral)   Resp 18   Ht 5\' 4"  (1.626 m)   Wt 65.8 kg   SpO2 100%   BMI 24.89 kg/m   Physical Exam Vitals and nursing note reviewed.  Constitutional:      General: She is not in acute distress.    Appearance: She is well-developed.  HENT:     Head: Normocephalic and atraumatic.  Eyes:     Conjunctiva/sclera: Conjunctivae normal.      Comments: Small corneal abrasion to the 2 o'clock  position on the left eye, no FB seen, eyelid everted  Cardiovascular:     Rate and Rhythm: Normal rate.     Heart sounds: No murmur heard.   Pulmonary:     Effort: Pulmonary effort is normal. No respiratory distress.  Abdominal:     General: There is no distension.  Musculoskeletal:     Cervical back: Neck supple.     Comments: Moves all extremities  Skin:    General: Skin is warm and dry.  Neurological:     Mental Status: She is alert and oriented to person, place, and time.  Psychiatric:        Mood and Affect: Mood normal.        Behavior: Behavior normal.     ED Results / Procedures / Treatments   Labs (all labs ordered are listed, but only abnormal results are displayed) Labs Reviewed - No data to display  EKG None  Radiology No results found.  Procedures Procedures (including critical care time)  Medications Ordered in ED Medications  tetracaine (PONTOCAINE) 0.5 % ophthalmic solution 1 drop (has no administration in time range)  fluorescein ophthalmic strip 1 strip (has no administration in time range)    ED Course  I have reviewed the triage vital signs and the nursing notes.  Pertinent labs & imaging results that were available during my care of the patient were reviewed by me and considered in my medical decision making (see chart for details).    MDM Rules/Calculators/A&P                          Patient here with left eye pain and foreign body sensation.  She has contact lens wear.  She believes that she accidentally put into contact lenses and only took one out.  There are no contact lenses visible.  The eyelid was everted.  There is no foreign body visible.  She does have a small corneal abrasion to the 2 o'clock position of her left eye.  She had resolution of her symptoms following instillation of tetracaine.  We will discharge with Cipro eyedrops and instruct patient not to wear contact lenses for the next several days.  Tylenol for pain. Final  Clinical Impression(s) / ED Diagnoses Final diagnoses:  Abrasion of left cornea, initial encounter    Rx / DC Orders ED Discharge Orders    None       , PA-C 07/27/20 0027    09/26/20, PA-C 07/27/20 0028    09/26/20, MD 07/27/20 [phone]

## 2020-11-28 DIAGNOSIS — H34831 Tributary (branch) retinal vein occlusion, right eye, with macular edema: Secondary | ICD-10-CM | POA: Diagnosis not present

## 2020-12-02 DIAGNOSIS — S52531A Colles' fracture of right radius, initial encounter for closed fracture: Secondary | ICD-10-CM | POA: Diagnosis not present

## 2020-12-06 DIAGNOSIS — H348312 Tributary (branch) retinal vein occlusion, right eye, stable: Secondary | ICD-10-CM | POA: Diagnosis not present

## 2020-12-09 DIAGNOSIS — M25531 Pain in right wrist: Secondary | ICD-10-CM | POA: Diagnosis not present

## 2020-12-23 DIAGNOSIS — M25531 Pain in right wrist: Secondary | ICD-10-CM | POA: Diagnosis not present

## 2021-01-08 DIAGNOSIS — L609 Nail disorder, unspecified: Secondary | ICD-10-CM | POA: Diagnosis not present

## 2021-01-13 DIAGNOSIS — S52531A Colles' fracture of right radius, initial encounter for closed fracture: Secondary | ICD-10-CM | POA: Diagnosis not present

## 2021-01-13 DIAGNOSIS — M25531 Pain in right wrist: Secondary | ICD-10-CM | POA: Diagnosis not present

## 2021-02-10 DIAGNOSIS — S52531A Colles' fracture of right radius, initial encounter for closed fracture: Secondary | ICD-10-CM | POA: Diagnosis not present

## 2021-02-10 DIAGNOSIS — M25531 Pain in right wrist: Secondary | ICD-10-CM | POA: Diagnosis not present

## 2021-03-01 DIAGNOSIS — M25531 Pain in right wrist: Secondary | ICD-10-CM | POA: Diagnosis not present

## 2021-03-01 DIAGNOSIS — M24131 Other articular cartilage disorders, right wrist: Secondary | ICD-10-CM | POA: Diagnosis not present

## 2021-03-01 DIAGNOSIS — S52501A Unspecified fracture of the lower end of right radius, initial encounter for closed fracture: Secondary | ICD-10-CM | POA: Diagnosis not present

## 2021-03-02 ENCOUNTER — Other Ambulatory Visit: Payer: Self-pay | Admitting: Orthopedic Surgery

## 2021-03-02 DIAGNOSIS — M24139 Other articular cartilage disorders, unspecified wrist: Secondary | ICD-10-CM

## 2021-03-07 DIAGNOSIS — H348312 Tributary (branch) retinal vein occlusion, right eye, stable: Secondary | ICD-10-CM | POA: Diagnosis not present

## 2021-03-07 DIAGNOSIS — H3561 Retinal hemorrhage, right eye: Secondary | ICD-10-CM | POA: Diagnosis not present

## 2021-03-07 DIAGNOSIS — H3582 Retinal ischemia: Secondary | ICD-10-CM | POA: Diagnosis not present

## 2021-03-10 DIAGNOSIS — J00 Acute nasopharyngitis [common cold]: Secondary | ICD-10-CM | POA: Diagnosis not present

## 2021-03-27 ENCOUNTER — Ambulatory Visit
Admission: RE | Admit: 2021-03-27 | Discharge: 2021-03-27 | Disposition: A | Discharge status: Home / Self Care | Payer: BC Managed Care – PPO | Source: Ambulatory Visit | Attending: Orthopedic Surgery | Admitting: Orthopedic Surgery

## 2021-03-27 DIAGNOSIS — M24139 Other articular cartilage disorders, unspecified wrist: Secondary | ICD-10-CM

## 2021-03-27 DIAGNOSIS — S6291XA Unspecified fracture of right wrist and hand, initial encounter for closed fracture: Secondary | ICD-10-CM | POA: Diagnosis not present

## 2021-03-27 DIAGNOSIS — S52611A Displaced fracture of right ulna styloid process, initial encounter for closed fracture: Secondary | ICD-10-CM | POA: Diagnosis not present

## 2021-03-27 MED ORDER — IOPAMIDOL (ISOVUE-M 200) INJECTION 41%
2.0000 mL | Freq: Once | INTRAMUSCULAR | Status: AC
  Administered 2021-03-27: 2 mL via INTRA_ARTICULAR

## 2021-04-03 DIAGNOSIS — M24131 Other articular cartilage disorders, right wrist: Secondary | ICD-10-CM | POA: Diagnosis not present

## 2021-04-03 DIAGNOSIS — M25531 Pain in right wrist: Secondary | ICD-10-CM | POA: Diagnosis not present

## 2021-06-05 DIAGNOSIS — M24131 Other articular cartilage disorders, right wrist: Secondary | ICD-10-CM | POA: Diagnosis not present

## 2021-06-05 DIAGNOSIS — M25531 Pain in right wrist: Secondary | ICD-10-CM | POA: Diagnosis not present

## 2021-06-23 DIAGNOSIS — M25531 Pain in right wrist: Secondary | ICD-10-CM | POA: Diagnosis not present

## 2021-07-19 DIAGNOSIS — M24131 Other articular cartilage disorders, right wrist: Secondary | ICD-10-CM | POA: Diagnosis not present

## 2021-08-17 ENCOUNTER — Other Ambulatory Visit: Payer: Self-pay | Admitting: Orthopedic Surgery

## 2021-09-08 DIAGNOSIS — K047 Periapical abscess without sinus: Secondary | ICD-10-CM | POA: Diagnosis not present

## 2021-10-04 DIAGNOSIS — M545 Low back pain, unspecified: Secondary | ICD-10-CM | POA: Diagnosis not present

## 2021-10-10 ENCOUNTER — Encounter (HOSPITAL_BASED_OUTPATIENT_CLINIC_OR_DEPARTMENT_OTHER): Payer: Self-pay | Admitting: Orthopedic Surgery

## 2021-10-10 ENCOUNTER — Other Ambulatory Visit: Payer: Self-pay

## 2021-10-18 NOTE — Progress Notes (Signed)
       Patient Instructions  The night before surgery:  No food after midnight. ONLY clear liquids after midnight  The day of surgery (if you do NOT have diabetes):  Drink ONE (1) Pre-Surgery Clear Ensure as directed.   This drink was given to you during your hospital  pre-op appointment visit. The pre-op nurse will instruct you on the time to drink the  Pre-Surgery Ensure depending on your surgery time. Finish the drink at the designated time by the pre-op nurse.  Nothing else to drink after completing the  Pre-Surgery Clear Ensure.  The day of surgery (if you have diabetes): Drink ONE (1) Gatorade 2 (G2) as directed. This drink was given to you during your hospital  pre-op appointment visit.  The pre-op nurse will instruct you on the time to drink the   Gatorade 2 (G2) depending on your surgery time. Color of the Gatorade may vary. Red is not allowed. Nothing else to drink after completing the  Gatorade 2 (G2).         If you have questions, please contact your surgeon's office.Patient was provided with CHG cleanser to use at home before the procedure. Patient verbalized understanding of instructions. 

## 2021-10-19 ENCOUNTER — Ambulatory Visit (HOSPITAL_COMMUNITY): Payer: BC Managed Care – PPO | Admitting: Anesthesiology

## 2021-10-19 ENCOUNTER — Ambulatory Visit (HOSPITAL_BASED_OUTPATIENT_CLINIC_OR_DEPARTMENT_OTHER)
Admission: RE | Admit: 2021-10-19 | Discharge: 2021-10-19 | Disposition: A | Discharge status: Home / Self Care | Payer: BC Managed Care – PPO | Source: Ambulatory Visit | Attending: Orthopedic Surgery | Admitting: Orthopedic Surgery

## 2021-10-19 ENCOUNTER — Encounter (HOSPITAL_BASED_OUTPATIENT_CLINIC_OR_DEPARTMENT_OTHER): Payer: Self-pay | Admitting: Orthopedic Surgery

## 2021-10-19 ENCOUNTER — Other Ambulatory Visit: Payer: Self-pay

## 2021-10-19 DIAGNOSIS — Z5309 Procedure and treatment not carried out because of other contraindication: Secondary | ICD-10-CM | POA: Diagnosis not present

## 2021-10-19 DIAGNOSIS — S63591A Other specified sprain of right wrist, initial encounter: Secondary | ICD-10-CM | POA: Insufficient documentation

## 2021-10-19 MED ORDER — MIDAZOLAM HCL 2 MG/2ML IJ SOLN
INTRAMUSCULAR | Status: AC
  Filled 2021-10-19: qty 2

## 2021-10-19 MED ORDER — FENTANYL CITRATE (PF) 100 MCG/2ML IJ SOLN
INTRAMUSCULAR | Status: AC
  Filled 2021-10-19: qty 2

## 2021-10-19 MED ORDER — LACTATED RINGERS IV SOLN: INTRAVENOUS | Status: DC

## 2021-10-19 MED ORDER — CEFAZOLIN SODIUM-DEXTROSE 2-4 GM/100ML-% IV SOLN
INTRAVENOUS | Status: AC
  Filled 2021-10-19: qty 100

## 2021-10-19 MED ORDER — ACETAMINOPHEN 500 MG PO TABS
ORAL_TABLET | ORAL | Status: AC
  Filled 2021-10-19: qty 2

## 2021-10-19 MED ORDER — CEFAZOLIN SODIUM-DEXTROSE 2-4 GM/100ML-% IV SOLN: 2.0000 g | INTRAVENOUS | Status: DC

## 2021-10-19 MED ORDER — ACETAMINOPHEN 500 MG PO TABS: 1000.0000 mg | ORAL_TABLET | Freq: Once | ORAL | Status: DC

## 2021-10-19 NOTE — Anesthesia Preprocedure Evaluation (Addendum)
Anesthesia Evaluation    Reviewed: Allergy & Precautions, Patient's Chart, lab work & pertinent test results  History of Anesthesia Complications (+) PONV and history of anesthetic complications  Airway        Dental   Pulmonary neg pulmonary ROS,           Cardiovascular negative cardio ROS       Neuro/Psych negative neurological ROS  negative psych ROS   GI/Hepatic Neg liver ROS, GERD  ,  Endo/Other  negative endocrine ROS  Renal/GU negative Renal ROS  negative genitourinary   Musculoskeletal negative musculoskeletal ROS (+)   Abdominal   Peds negative pediatric ROS (+)  Hematology negative hematology ROS (+)   Anesthesia Other Findings   Reproductive/Obstetrics negative OB ROS                            Anesthesia Physical Anesthesia Plan  ASA: 2  Anesthesia Plan: Regional   Post-op Pain Management: Minimal or no pain anticipated   Induction: Intravenous  PONV Risk Score and Plan: 3 and Ondansetron, Propofol infusion, Dexamethasone, Treatment may vary due to age or medical condition and Midazolam  Airway Management Planned: Natural Airway and Simple Face Mask  Additional Equipment: None  Intra-op Plan:   Post-operative Plan:   Informed Consent:     Dental advisory given  Plan Discussed with:   Anesthesia Plan Comments: (R )       Anesthesia Quick Evaluation

## 2021-10-19 NOTE — Progress Notes (Signed)
Procedure cancelled by Dr. Cindee Salt due to excoriated skin around fingernails on right hand. Antibiotic ordered to be picked up by patient. Patient verbalized understanding of reason surgery had to be cancelled.

## 2021-10-24 ENCOUNTER — Encounter (HOSPITAL_BASED_OUTPATIENT_CLINIC_OR_DEPARTMENT_OTHER): Payer: Self-pay | Admitting: Orthopedic Surgery

## 2021-10-24 ENCOUNTER — Other Ambulatory Visit: Payer: Self-pay

## 2021-10-31 ENCOUNTER — Other Ambulatory Visit: Payer: Self-pay | Admitting: Orthopedic Surgery

## 2021-11-01 NOTE — Progress Notes (Signed)
° ° ° ° °  Enhanced Recovery after Surgery for Orthopedics °Enhanced Recovery after Surgery is a protocol used to improve the stress on your body and your recovery after surgery. ° °Patient Instructions ° °The night before surgery:  °No food after midnight. ONLY clear liquids after midnight ° °The day of surgery (if you do NOT have diabetes):  °Drink ONE (1) Pre-Surgery Clear Ensure as directed.   °This drink was given to you during your hospital  °pre-op appointment visit. °The pre-op nurse will instruct you on the time to drink the  °Pre-Surgery Ensure depending on your surgery time. °Finish the drink at the designated time by the pre-op nurse.  °Nothing else to drink after completing the  °Pre-Surgery Clear Ensure. ° °The day of surgery (if you have diabetes): °Drink ONE (1) Gatorade 2 (G2) as directed. °This drink was given to you during your hospital  °pre-op appointment visit.  °The pre-op nurse will instruct you on the time to drink the  ° Gatorade 2 (G2) depending on your surgery time. °Color of the Gatorade may vary. Red is not allowed. °Nothing else to drink after completing the  °Gatorade 2 (G2). ° °       If you have questions, please contact your surgeon’s office. ° ° ° °Surgical soap given to patient, verbalizes understanding. °

## 2021-11-02 ENCOUNTER — Ambulatory Visit (HOSPITAL_BASED_OUTPATIENT_CLINIC_OR_DEPARTMENT_OTHER): Payer: BC Managed Care – PPO

## 2021-11-02 ENCOUNTER — Encounter (HOSPITAL_BASED_OUTPATIENT_CLINIC_OR_DEPARTMENT_OTHER)
Admission: RE | Disposition: A | Discharge status: Home / Self Care | Payer: Self-pay | Source: Home / Self Care | Attending: Orthopedic Surgery

## 2021-11-02 ENCOUNTER — Ambulatory Visit (HOSPITAL_BASED_OUTPATIENT_CLINIC_OR_DEPARTMENT_OTHER)
Admission: RE | Admit: 2021-11-02 | Discharge: 2021-11-02 | Disposition: A | Discharge status: Home / Self Care | Payer: BC Managed Care – PPO | Attending: Orthopedic Surgery | Admitting: Orthopedic Surgery

## 2021-11-02 ENCOUNTER — Encounter (HOSPITAL_BASED_OUTPATIENT_CLINIC_OR_DEPARTMENT_OTHER): Payer: Self-pay | Admitting: Orthopedic Surgery

## 2021-11-02 ENCOUNTER — Other Ambulatory Visit: Payer: Self-pay

## 2021-11-02 DIAGNOSIS — S63591A Other specified sprain of right wrist, initial encounter: Secondary | ICD-10-CM | POA: Insufficient documentation

## 2021-11-02 DIAGNOSIS — X58XXXA Exposure to other specified factors, initial encounter: Secondary | ICD-10-CM | POA: Insufficient documentation

## 2021-11-02 DIAGNOSIS — Z8781 Personal history of (healed) traumatic fracture: Secondary | ICD-10-CM | POA: Diagnosis not present

## 2021-11-02 DIAGNOSIS — M24131 Other articular cartilage disorders, right wrist: Secondary | ICD-10-CM | POA: Diagnosis not present

## 2021-11-02 DIAGNOSIS — Z9889 Other specified postprocedural states: Secondary | ICD-10-CM | POA: Diagnosis not present

## 2021-11-02 DIAGNOSIS — K219 Gastro-esophageal reflux disease without esophagitis: Secondary | ICD-10-CM | POA: Insufficient documentation

## 2021-11-02 DIAGNOSIS — Z9884 Bariatric surgery status: Secondary | ICD-10-CM | POA: Diagnosis not present

## 2021-11-02 HISTORY — DX: Gastro-esophageal reflux disease without esophagitis: K21.9

## 2021-11-02 HISTORY — PX: WRIST ARTHROSCOPY WITH FOVEAL TRIANGULAR FIBROCARTILAGE COMPLEX REPAIR: SHX6403

## 2021-11-02 SURGERY — WRIST ARTHROSCOPY WITH DEBRIDEMENT
Anesthesia: Regional | Site: Wrist | Laterality: Right

## 2021-11-02 SURGERY — WRIST ARTHROSCOPY WITH FOVEAL TRIANGULAR FIBROCARTILAGE COMPLEX REPAIR
Anesthesia: Regional | Site: Wrist | Laterality: Right

## 2021-11-02 MED ORDER — OXYCODONE HCL 5 MG/5ML PO SOLN: 5.0000 mg | Freq: Once | ORAL | Status: DC | PRN

## 2021-11-02 MED ORDER — CEFAZOLIN SODIUM-DEXTROSE 2-4 GM/100ML-% IV SOLN
INTRAVENOUS | Status: AC
  Filled 2021-11-02: qty 100

## 2021-11-02 MED ORDER — ONDANSETRON HCL 4 MG/2ML IJ SOLN: 4.0000 mg | Freq: Once | INTRAMUSCULAR | Status: DC | PRN

## 2021-11-02 MED ORDER — ROPIVACAINE HCL 5 MG/ML IJ SOLN
INTRAMUSCULAR | Status: DC | PRN
  Administered 2021-11-02: 150 mg via PERINEURAL

## 2021-11-02 MED ORDER — OXYCODONE HCL 5 MG PO TABS: 5.0000 mg | ORAL_TABLET | Freq: Once | ORAL | Status: DC | PRN

## 2021-11-02 MED ORDER — SODIUM CHLORIDE 0.9 % IR SOLN
Status: DC | PRN
  Administered 2021-11-02: 1

## 2021-11-02 MED ORDER — LACTATED RINGERS IV SOLN: INTRAVENOUS | Status: DC

## 2021-11-02 MED ORDER — PROPOFOL 10 MG/ML IV BOLUS
INTRAVENOUS | Status: DC | PRN
  Administered 2021-11-02: 10 mg via INTRAVENOUS
  Administered 2021-11-02: 30 mg via INTRAVENOUS

## 2021-11-02 MED ORDER — BUPIVACAINE HCL (PF) 0.25 % IJ SOLN
INTRAMUSCULAR | Status: AC
  Filled 2021-11-02: qty 30

## 2021-11-02 MED ORDER — FENTANYL CITRATE (PF) 100 MCG/2ML IJ SOLN
INTRAMUSCULAR | Status: AC
  Filled 2021-11-02: qty 2

## 2021-11-02 MED ORDER — AMISULPRIDE (ANTIEMETIC) 5 MG/2ML IV SOLN: 10.0000 mg | Freq: Once | INTRAVENOUS | Status: DC | PRN

## 2021-11-02 MED ORDER — PROPOFOL 500 MG/50ML IV EMUL
INTRAVENOUS | Status: DC | PRN
Start: 2021-11-02 — End: 2021-11-02
  Administered 2021-11-02: 75 ug/kg/min via INTRAVENOUS

## 2021-11-02 MED ORDER — OXYCODONE-ACETAMINOPHEN 5-325 MG PO TABS: 1.0000 | ORAL_TABLET | ORAL | 0 refills | Status: AC | PRN

## 2021-11-02 MED ORDER — HYDROMORPHONE HCL 1 MG/ML IJ SOLN: 0.2500 mg | INTRAMUSCULAR | Status: DC | PRN

## 2021-11-02 MED ORDER — ONDANSETRON HCL 4 MG/2ML IJ SOLN
INTRAMUSCULAR | Status: DC | PRN
Start: 2021-11-02 — End: 2021-11-02
  Administered 2021-11-02: 4 mg via INTRAVENOUS

## 2021-11-02 MED ORDER — ACETAMINOPHEN 10 MG/ML IV SOLN: 1000.0000 mg | Freq: Once | INTRAVENOUS | Status: DC | PRN

## 2021-11-02 MED ORDER — CEFAZOLIN SODIUM-DEXTROSE 2-4 GM/100ML-% IV SOLN
2.0000 g | INTRAVENOUS | Status: AC
  Administered 2021-11-02: 2 g via INTRAVENOUS

## 2021-11-02 MED ORDER — LIDOCAINE HCL (CARDIAC) PF 100 MG/5ML IV SOSY
PREFILLED_SYRINGE | INTRAVENOUS | Status: DC | PRN
  Administered 2021-11-02: 40 mg via INTRAVENOUS

## 2021-11-02 MED ORDER — FENTANYL CITRATE (PF) 100 MCG/2ML IJ SOLN
100.0000 ug | Freq: Once | INTRAMUSCULAR | Status: AC
  Administered 2021-11-02: 100 ug via INTRAVENOUS

## 2021-11-02 MED ORDER — MIDAZOLAM HCL 2 MG/2ML IJ SOLN
2.0000 mg | Freq: Once | INTRAMUSCULAR | Status: AC
  Administered 2021-11-02: 2 mg via INTRAVENOUS

## 2021-11-02 MED ORDER — CLONIDINE HCL (ANALGESIA) 100 MCG/ML EP SOLN
EPIDURAL | Status: DC | PRN
  Administered 2021-11-02: 100 ug

## 2021-11-02 MED ORDER — MIDAZOLAM HCL 2 MG/2ML IJ SOLN
INTRAMUSCULAR | Status: AC
  Filled 2021-11-02: qty 2

## 2021-11-02 SURGICAL SUPPLY — 71 items
APL PRP STRL LF DISP 70% ISPRP (MISCELLANEOUS) ×1
BLADE EAR TYMPAN 2.5 60D BEAV (BLADE) IMPLANT
BLADE MINI RND TIP GREEN BEAV (BLADE) IMPLANT
BLADE SURG 15 STRL LF DISP TIS (BLADE) ×1 IMPLANT
BLADE SURG 15 STRL SS (BLADE) ×2
BNDG CMPR 9X4 STRL LF SNTH (GAUZE/BANDAGES/DRESSINGS) ×1
BNDG COHESIVE 3X5 TAN ST LF (GAUZE/BANDAGES/DRESSINGS) ×2 IMPLANT
BNDG ESMARK 4X9 LF (GAUZE/BANDAGES/DRESSINGS) ×1 IMPLANT
BNDG GAUZE ELAST 4 BULKY (GAUZE/BANDAGES/DRESSINGS) ×2 IMPLANT
CANISTER SUCT 1200ML W/VALVE (MISCELLANEOUS) ×1 IMPLANT
CHLORAPREP W/TINT 26 (MISCELLANEOUS) ×2 IMPLANT
CORD BIPOLAR FORCEPS 12FT (ELECTRODE) IMPLANT
COVER BACK TABLE 60X90IN (DRAPES) ×2 IMPLANT
COVER MAYO STAND STRL (DRAPES) ×2 IMPLANT
CUFF TOURN SGL QUICK 18 NS (TOURNIQUET CUFF) ×1 IMPLANT
CUFF TOURN SGL QUICK 18X4 (TOURNIQUET CUFF) IMPLANT
DRAPE EXTREMITY T 121X128X90 (DISPOSABLE) ×2 IMPLANT
DRAPE IMP U-DRAPE 54X76 (DRAPES) ×2 IMPLANT
DRAPE OEC MINIVIEW 54X84 (DRAPES) IMPLANT
DRAPE SURG 17X23 STRL (DRAPES) ×1 IMPLANT
DRAPE U-SHAPE 47X51 STRL (DRAPES) ×1 IMPLANT
GAUZE SPONGE 4X4 12PLY STRL (GAUZE/BANDAGES/DRESSINGS) ×2 IMPLANT
GAUZE XEROFORM 1X8 LF (GAUZE/BANDAGES/DRESSINGS) ×2 IMPLANT
GLOVE SURG ORTHO LTX SZ8 (GLOVE) ×2 IMPLANT
GLOVE SURG POLYISO LF SZ6.5 (GLOVE) ×1 IMPLANT
GLOVE SURG POLYISO LF SZ7 (GLOVE) ×1 IMPLANT
GLOVE SURG UNDER POLY LF SZ7 (GLOVE) ×3 IMPLANT
GLOVE SURG UNDER POLY LF SZ8.5 (GLOVE) ×2 IMPLANT
GOWN STRL REUS W/ TWL LRG LVL3 (GOWN DISPOSABLE) ×1 IMPLANT
GOWN STRL REUS W/TWL LRG LVL3 (GOWN DISPOSABLE) ×4
GOWN STRL REUS W/TWL XL LVL3 (GOWN DISPOSABLE) ×3 IMPLANT
IV NS IRRIG 3000ML ARTHROMATIC (IV SOLUTION) ×2 IMPLANT
IV SET EXT 30 76VOL 4 MALE LL (IV SETS) ×2 IMPLANT
MANIFOLD NEPTUNE II (INSTRUMENTS) IMPLANT
NDL EPIDURAL TUOHY 20GX3.5 (NEEDLE) IMPLANT
NDL SAFETY ECLIPSE 18X1.5 (NEEDLE) ×3 IMPLANT
NDL SPNL 18GX3.5 QUINCKE PK (NEEDLE) IMPLANT
NEEDLE HYPO 18GX1.5 SHARP (NEEDLE) ×6
NEEDLE HYPO 22GX1.5 SAFETY (NEEDLE) ×2 IMPLANT
NEEDLE SPNL 18GX3.5 QUINCKE PK (NEEDLE) IMPLANT
NEEDLE TUOHY 20GX3.5 (NEEDLE) IMPLANT
NS IRRIG 1000ML POUR BTL (IV SOLUTION) IMPLANT
PACK BASIN DAY SURGERY FS (CUSTOM PROCEDURE TRAY) ×2 IMPLANT
PAD CAST 3X4 CTTN HI CHSV (CAST SUPPLIES) ×1 IMPLANT
PADDING CAST ABS 3INX4YD NS (CAST SUPPLIES) ×1
PADDING CAST ABS 4INX4YD NS (CAST SUPPLIES) ×1
PADDING CAST ABS COTTON 3X4 (CAST SUPPLIES) ×1 IMPLANT
PADDING CAST ABS COTTON 4X4 ST (CAST SUPPLIES) ×1 IMPLANT
PADDING CAST COTTON 3X4 STRL (CAST SUPPLIES) ×2
SET SM JOINT TUBING/CANN (CANNULA) IMPLANT
SHAVER DISSECTOR 3.0 (BURR) IMPLANT
SHAVER SABRE 2.0 (BURR) ×1 IMPLANT
SLEEVE SCD COMPRESS KNEE MED (STOCKING) ×1 IMPLANT
SLING ARM FOAM STRAP LRG (SOFTGOODS) ×1 IMPLANT
SPLINT PLASTER CAST XFAST 3X15 (CAST SUPPLIES) IMPLANT
SPLINT PLASTER XTRA FASTSET 3X (CAST SUPPLIES) ×24
STOCKINETTE 4X48 STRL (DRAPES) ×2 IMPLANT
SUCTION FRAZIER HANDLE 10FR (MISCELLANEOUS)
SUCTION TUBE FRAZIER 10FR DISP (MISCELLANEOUS) IMPLANT
SUT ETHILON 4 0 PS 2 18 (SUTURE) ×2 IMPLANT
SUT MERSILENE 4 0 P 3 (SUTURE) IMPLANT
SUT PDS AB 2-0 CT2 27 (SUTURE) IMPLANT
SUT STEEL 3 0 (SUTURE) ×1 IMPLANT
SUT VIC AB 2-0 PS2 27 (SUTURE) ×1 IMPLANT
SUT VICRYL 4-0 PS2 18IN ABS (SUTURE) IMPLANT
SYR BULB EAR ULCER 3OZ GRN STR (SYRINGE) ×2 IMPLANT
SYR CONTROL 10ML LL (SYRINGE) ×2 IMPLANT
TUBE CONNECTING 20X1/4 (TUBING) ×1 IMPLANT
TUBING ARTHROSCOPY IRRIG 16FT (MISCELLANEOUS) ×1 IMPLANT
UNDERPAD 30X36 HEAVY ABSORB (UNDERPADS AND DIAPERS) ×2 IMPLANT
WAND 1.5 MICROBLATOR (SURGICAL WAND) ×1 IMPLANT

## 2021-11-02 NOTE — Anesthesia Postprocedure Evaluation (Signed)
Anesthesia Post Note  Patient: Jade Strickland  Procedure(s) Performed: ARTHROSCOPY RIGHT WRIST DEBRIDEMENT WITH TRIANGULAR FIBROCARTILAGE COMPLEX REPAIR (Right: Wrist)     Patient location during evaluation: PACU Anesthesia Type: Regional Level of consciousness: awake and alert Pain management: pain level controlled Vital Signs Assessment: post-procedure vital signs reviewed and stable Respiratory status: spontaneous breathing, nonlabored ventilation, respiratory function stable and patient connected to nasal cannula oxygen Cardiovascular status: blood pressure returned to baseline and stable Postop Assessment: no apparent nausea or vomiting Anesthetic complications: no   No notable events documented.  Last Vitals:  Vitals:   11/02/21 1507 11/02/21 1515  BP: 132/77 139/81  Pulse: (!) 50 (!) 49  Resp: 12 16  Temp: 36.6 C   SpO2: 99% 99%    Last Pain:  Vitals:   11/02/21 1507  TempSrc:   PainSc: 0-No pain                 Trevor Iha

## 2021-11-02 NOTE — Anesthesia Procedure Notes (Signed)
Procedure Name: MAC Date/Time: 11/02/2021 1:35 PM Performed by: Glory Buff, CRNA Pre-anesthesia Checklist: Patient identified, Emergency Drugs available, Suction available and Patient being monitored Oxygen Delivery Method: Simple face mask

## 2021-11-02 NOTE — Transfer of Care (Signed)
Immediate Anesthesia Transfer of Care Note  Patient: Jade Strickland  Procedure(s) Performed: ARTHROSCOPY RIGHT WRIST DEBRIDEMENT WITH TRIANGULAR FIBROCARTILAGE COMPLEX REPAIR (Right: Wrist)  Patient Location: PACU  Anesthesia Type:MAC and Regional  Level of Consciousness: awake, alert  and oriented  Airway & Oxygen Therapy: Patient Spontanous Breathing and Patient connected to face mask oxygen  Post-op Assessment: Report given to RN and Post -op Vital signs reviewed and stable  Post vital signs: Reviewed and stable  Last Vitals:  Vitals Value Taken Time  BP    Temp    Pulse    Resp    SpO2      Last Pain:  Vitals:   11/02/21 1143  TempSrc: Oral  PainSc: 3          Complications: No notable events documented.

## 2021-11-02 NOTE — Progress Notes (Signed)
Assisted Dr. Houser with right, ultrasound guided, supraclavicular block. Side rails up, monitors on throughout procedure. See vital signs in flow sheet. Tolerated Procedure well. 

## 2021-11-02 NOTE — Discharge Instructions (Addendum)
[facility] Center Instructions Hand Surgery  Wound Care: Keep your hand elevated above the level of your heart.  Do not allow it to dangle by your side.  Keep the dressing dry and do not remove it unless your doctor advises you to do so.  He will usually change it at the time of your post-op visit.  Moving your fingers is advised to stimulate circulation but will depend on the site of your surgery.  If you have a splint applied, your doctor will advise you regarding movement.  Activity: Do not drive or operate machinery today.  Rest today and then you may return to your normal activity and work as indicated by your physician.  Diet:  Drink liquids today or eat a light diet.  You may resume a regular diet tomorrow.     Post Anesthesia Home Care Instructions  Activity: Get plenty of rest for the remainder of the day. A responsible individual must stay with you for 24 hours following the procedure.  For the next 24 hours, DO NOT: -Drive a car -Operate machinery -Drink alcoholic beverages -Take any medication unless instructed by your physician -Make any legal decisions or sign important papers.  Meals: Start with liquid foods such as gelatin or soup. Progress to regular foods as tolerated. Avoid greasy, spicy, heavy foods. If nausea and/or vomiting occur, drink only clear liquids until the nausea and/or vomiting subsides. Call your physician if vomiting continues.  Special Instructions/Symptoms: Your throat may feel dry or sore from the anesthesia or the breathing tube placed in your throat during surgery. If this causes discomfort, gargle with warm salt water. The discomfort should disappear within 24 hours.  If you had a scopolamine patch placed behind your ear for the management of post- operative nausea and/or vomiting:  1. The medication in the patch is effective for 72 hours, after which it should be removed.  Wrap patch in a tissue and discard in the trash. Wash hands thoroughly with  soap and water. 2. You may remove the patch earlier than 72 hours if you experience unpleasant side effects which may include dry mouth, dizziness or visual disturbances. 3. Avoid touching the patch. Wash your hands with soap and water after contact with the patch.    Regional Anesthesia Blocks  1. Numbness or the inability to move the "blocked" extremity may last from 3-48 hours after placement. The length of time depends on the medication injected and your individual response to the medication. If the numbness is not going away after 48 hours, call your surgeon.  2. The extremity that is blocked will need to be protected until the numbness is gone and the  Strength has returned. Because you cannot feel it, you will need to take extra care to avoid injury. Because it may be weak, you may have difficulty moving it or using it. You may not know what position it is in without looking at it while the block is in effect.  3. For blocks in the legs and feet, returning to weight bearing and walking needs to be done carefully. You will need to wait until the numbness is entirely gone and the strength has returned. You should be able to move your leg and foot normally before you try and bear weight or walk. You will need someone to be with you when you first try to ensure you do not fall and possibly risk injury.  4. Bruising and tenderness at the needle site are common side effects and   will resolve in a few days.  5. Persistent numbness or new problems with movement should be communicated to the surgeon or the [facility] ([phone])/ [facility] ([phone]).  General expectations: Pain for two to three days. Fingers may become slightly swollen.  Call your doctor if any of the following occur: Severe pain not relieved by pain medication. Elevated temperature. Dressing soaked with blood. Inability to move fingers. White or bluish color to fingers.  

## 2021-11-02 NOTE — Op Note (Signed)
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   ASSISTANT: Betha Loa, MD   ANESTHESIA:  Regional with sedation   INTRAVENOUS FLUIDS:  Per anesthesia flow sheet.   ESTIMATED BLOOD LOSS:  Minimal.   COMPLICATIONS:  None.   SPECIMENS:  none   TOURNIQUET TIME:    Total Tourniquet Time Documented: Upper Arm (Right) - 34 minutes Total: Upper Arm (Right) - 34 minutes    DISPOSITION:  Stable to PACU.   INDICATIONS: Jade Strickland is a 61 year old female sustained a fracture to the right distal radius approximately a year ago.  She was treated conservatively for this with healing and angulated position.  She has had ulnar-sided wrist pain since that time arthrogram MRI reveals that she has a central tear of the TFCC with questionable peripheral tear from bone.  We have discussed arthroscopic inspection possible repair as dictated by findings.  Prepare postoperative course been discussed along with risks and complications.  She is aware there is no guarantee to the surgery the possibility of infection recurrence injury to arteries nerves tendons incomplete relief symptoms dystrophy.  Preoperative area the patient seen extremity marked by both patient and surgeon antibiotic given.  A supraclavicular block was carried out under the direction of the anesthesia department  OPERATIVE COURSE: Patient is brought to the operating room placed in the supine position with the right arm free.  Prep was done with ChloraPrep 3-minute dry time allowed timeout taken to confirm  patient procedure.  The limb was placed in the arc arthroscopy tower 10 pounds of traction applied.  Portals were marked.  The joint was then insufflated to the 3-4 portal.  Transverse incision made deepened with hemostat a blunt trocar was used to enter the joint.  The joint was inspected volar radial wrist ligaments were intact the fracture was healed with no significant step-off of the articular surface.  Scapholunate ligament was intact a tear of the triangular fibrocartilage complex was immediately apparent this was a 1 a tear.  Lunotriquetral joint appeared intact.  A irrigation catheter was placed in 6 you.  A 4-5 portal was then opened after localization with a 22-gauge needle transverse incision made deepened with a hemostat blunt trauma used to enter the joint.  A probe was inserted and the TFCC tear was easily identified.  A moderate synovitis was present around the entire joint.  An ArthroWand was then used to remove the TFCC torn portion along with a full-radius shaver taking care not to overheat the joint.  The peripheral side of the TFCC was able to be easily lifted from the styloid.  Was decided to examine the midcarpal joint a transverse incision made after localization with a 22-gauge needle to the ulnar midcarpal portal.  A blunt trocar was used to enter the joint after a widening with a hemostat.  Joint was inspected there was no change on the proximal hamate or capitate.  The lunotriquetral joint appeared stable along with the scapholunate ligament.  The scope was reintroduced in the 3-4 portal after examination of the ulnar side through the 4-5  portal.  A incision was then made on the ulnar aspect of the wrist at the level of the styloid and slightly proximal an 18-gauge needle was then passed through bone into the triangular fibrocartilage complex ulnar side.  It was decided to proceed with repair using mapped portion along with the irrigation catheter.  A 30-gauge stainless steel wire was  then passed through the 18-gauge needle and a 2-0 Vicryl through the other needle.  These were then grasped with a grasper pulled through the 4-5 portal the suture was then passed through the stainless steel wire and the 2 18-gauge needles were removed and then the suture pulled through the 2 egress of the 18-gauge needles.  These were connected by incising the skin between and the Vicryl suture was then tied over bone securing the ulnar aspect of the TFCC against the ulnar styloid for repair taking care that the dorsal sensory branch of the ulnar nerve was involved in the suture.  No further instability was noted.  The instruments were removed.  The incision on the ulnar side of the wrist was closed with interrupted 4-0 nylon sutures along with the portals on the dorsal aspect.  A sterile compressive dressing and a dorsal palmar Munster splint was applied in neutral position.  The tourniquet was inflated to 250 mmHg during the repair portion of the procedure this was deflated all fingers immediately pink and she was taken to the recovery room for observation in satisfactory condition.  She will be discharged home to return to hand center [place] in 1 week on Percocet.  Cindee Salt, MD Electronically signed, 11/02/21

## 2021-11-02 NOTE — Anesthesia Procedure Notes (Addendum)
Anesthesia Regional Block: Supraclavicular block   Pre-Anesthetic Checklist: , timeout performed,  Correct Patient, Correct Site, Correct Laterality,  Correct Procedure, Correct Position, site marked,  Risks and benefits discussed,  Surgical consent,  Pre-op evaluation,  At surgeon's request and post-op pain management  Laterality: Upper and Right  Prep: chloraprep       Needles:  Injection technique: Single-shot  Needle Type: Echogenic Needle     Needle Length: 5cm  Needle Gauge: 21     Additional Needles:   Procedures:,,,, ultrasound used (permanent image in chart),,    Narrative:  Start time: 11/02/2021 12:24 PM End time: 11/02/2021 12:30 PM Injection made incrementally with aspirations every 5 mL.  Performed by: Personally  Anesthesiologist: Trevor Iha, MD

## 2021-11-02 NOTE — H&P (Signed)
°  Jade Strickland is an 61 y.o. female.   Chief Complaint: Wrist pain post distal radius fracture right wrist HPI: Jade Strickland is a 61 year old right-hand-dominant female who presents with a complaint of right wrist pain following a distal radius fracture which occurred on 10/08/2020. She was seen and treated by Dr. Roney Mans with casting.. This was done for a month then bracing afterwards. The fracture apparently was not reduced. The was sent to therapy but has had continued on left-sided pain sharp dull throbbing aching with a VAS score over 10 on a general basis 8-9 if she rotates her wrist she has had 1 injection which gave her minimal release  She is status post fusion on her left side done by Dr. Cypher years ago she has had carpal tunnel release on her right side. She has a family history of rheumatoid arthritis no history of diabetes thyroid problems or gout knee either herself or family. She does not want to take nonsteroidal anti-inflammatories because of gastric bypass  Past Medical History:  Past Medical History:  Diagnosis Date   Complication of anesthesia    GERD (gastroesophageal reflux disease)    PONV (postoperative nausea and vomiting)     Past Surgical History:  Procedure Laterality Date   ABDOMINOPLASTY/PANNICULECTOMY  2005   CHOLECYSTECTOMY     GASTRIC BYPASS  1998   MANDIBLE FRACTURE SURGERY  2001   upper   TONSILLECTOMY     TOTAL HIP ARTHROPLASTY N/A 03/03/2019   Procedure: TOTAL HIP ARTHROPLASTY ANTERIOR APPROACH WITH LEFT HIP INTRAARTICULAR INJECTION;  Surgeon: Durene Romans, MD;  Location: [facility];  Service: Orthopedics;  Laterality: N/A;  70 mins   WRIST FUSION Left 1985    History reviewed. No pertinent family history. Social History:  reports that she has never smoked. She has never used smokeless tobacco. She reports current alcohol use. She reports that she does not use drugs.  Allergies:  Allergies  Allergen Reactions   Hydrocodone Nausea And Vomiting   Nsaids      Hx of gastric bypass   Sulfa Antibiotics Hives    No medications prior to admission.    No results found for this or any previous visit (from the past 48 hour(s)).  No results found.   Pertinent items are noted in HPI.  Height 5\' 3"  (1.6 m), weight 64 kg, last menstrual period 02/27/2008.  General appearance: alert, cooperative, and appears stated age Head: Normocephalic, without obvious abnormality Neck: no adenopathy, no carotid bruit, no JVD, supple, symmetrical, trachea midline, and thyroid not enlarged, symmetric, no tenderness/mass/nodules Resp: clear to auscultation bilaterally Cardio: regular rate and rhythm, S1, S2 normal, no murmur, click, rub or gallop GI: soft, non-tender; bowel sounds normal; no masses,  no organomegaly Extremities: Wrist pain post distal radius fracture right wrist Pulses: 2+ and symmetric Skin: Skin color, texture, turgor normal. No rashes or lesions Neurologic: Grossly normal Incision/Wound: na  Assessment/Plan Diagnosed TFCC tear right wrist Plan: She would like to proceed with arthroscopic inspection debridement and repair as necessary and dictated by findings. Prepare postoperative course been discussed along with risk and complications. She is aware that there is no guarantee to the surgery the possibility of infection recurrence injury to arteries nerves tendons incomplete relief symptoms dystrophy. She is scheduled for arthroscopy right wrist with debridement possible repair TFCC as an outpatient under regional anesthesia.  04/28/2008 11/02/2021, 10:41 AM

## 2021-11-02 NOTE — Op Note (Signed)
I assisted Surgeon(s) and Role:    * Cindee Salt, MD - Primary    * Betha Loa, MD - Assisting on the Procedure(s): ARTHROSCOPY RIGHT WRIST DEBRIDEMENT WITH TRIANGULAR FIBROCARTILAGE COMPLEX REPAIR on 11/02/2021.  I provided assistance on this case as follows: setup and management of arthroscopy equipment.  Electronically signed by: Betha Loa, MD Date: 11/02/2021 Time: 3:05 PM

## 2021-11-02 NOTE — Brief Op Note (Signed)
11/02/2021  3:03 PM  PATIENT:  Jade Strickland  61 y.o. female  PRE-OPERATIVE DIAGNOSIS:  TRIANGULAR FIBROCARTILAGE COMPLEX TEAR RIGHT WRIST  POST-OPERATIVE DIAGNOSIS:  TRIANGULAR FIBROCARTILAGE COMPLEX TEAR RIGHT WRIST  PROCEDURE:  Procedure(s): ARTHROSCOPY RIGHT WRIST DEBRIDEMENT WITH TRIANGULAR FIBROCARTILAGE COMPLEX REPAIR (Right)  SURGEON:  Surgeon(s) and Role:    * Cindee Salt, MD - Primary    * Betha Loa, MD - Assisting  PHYSICIAN ASSISTANT:   ASSISTANTS: K Tyarra Nolton,MD   ANESTHESIA:   regional and IV sedation  EBL:  0 mL   BLOOD ADMINISTERED:none  DRAINS: none   LOCAL MEDICATIONS USED:  NONE  SPECIMEN:  No Specimen  DISPOSITION OF SPECIMEN:  N/A  COUNTS:  YES  TOURNIQUET:  * Missing tourniquet times found for documented tourniquets in log: [id number] *  DICTATION: .Dragon Dictation  PLAN OF CARE: Discharge to home after PACU  PATIENT DISPOSITION:  PACU - hemodynamically stable.

## 2021-11-02 NOTE — Anesthesia Preprocedure Evaluation (Addendum)
Anesthesia Evaluation  Patient identified by MRN, date of birth, ID band Patient awake    Reviewed: Allergy & Precautions, Patient's Chart, lab work & pertinent test results  History of Anesthesia Complications (+) PONV  Airway Mallampati: II  TM Distance: >3 FB Neck ROM: Full    Dental no notable dental hx. (+) Teeth Intact, Dental Advisory Given   Pulmonary neg pulmonary ROS,    Pulmonary exam normal breath sounds clear to auscultation       Cardiovascular Exercise Tolerance: Good negative cardio ROS Normal cardiovascular exam Rhythm:Regular Rate:Normal     Neuro/Psych negative neurological ROS     GI/Hepatic Neg liver ROS, GERD  ,  Endo/Other  negative endocrine ROS  Renal/GU negative Renal ROS     Musculoskeletal negative musculoskeletal ROS (+)   Abdominal   Peds  Hematology   Anesthesia Other Findings   Reproductive/Obstetrics                             Anesthesia Physical Anesthesia Plan  ASA: 2  Anesthesia Plan: Regional   Post-op Pain Management: Regional block and Minimal or no pain anticipated   Induction:   PONV Risk Score and Plan: 3 and Treatment may vary due to age or medical condition, Midazolam and Ondansetron  Airway Management Planned: Natural Airway and Simple Face Mask  Additional Equipment:   Intra-op Plan:   Post-operative Plan:   Informed Consent: I have reviewed the patients History and Physical, chart, labs and discussed the procedure including the risks, benefits and alternatives for the proposed anesthesia with the patient or authorized representative who has indicated his/her understanding and acceptance.     Dental advisory given  Plan Discussed with:   Anesthesia Plan Comments: (MAC and R Supraclav block)        Anesthesia Quick Evaluation

## 2021-11-03 ENCOUNTER — Encounter (HOSPITAL_BASED_OUTPATIENT_CLINIC_OR_DEPARTMENT_OTHER): Payer: Self-pay | Admitting: Orthopedic Surgery

## 2021-11-08 DIAGNOSIS — M25531 Pain in right wrist: Secondary | ICD-10-CM | POA: Diagnosis not present

## 2021-11-08 DIAGNOSIS — M25631 Stiffness of right wrist, not elsewhere classified: Secondary | ICD-10-CM | POA: Diagnosis not present

## 2021-11-08 DIAGNOSIS — Z4889 Encounter for other specified surgical aftercare: Secondary | ICD-10-CM | POA: Diagnosis not present

## 2021-11-08 DIAGNOSIS — S63591D Other specified sprain of right wrist, subsequent encounter: Secondary | ICD-10-CM | POA: Diagnosis not present

## 2021-12-18 DIAGNOSIS — M1812 Unilateral primary osteoarthritis of first carpometacarpal joint, left hand: Secondary | ICD-10-CM | POA: Diagnosis not present

## 2021-12-18 DIAGNOSIS — S6981XD Other specified injuries of right wrist, hand and finger(s), subsequent encounter: Secondary | ICD-10-CM | POA: Diagnosis not present

## 2021-12-19 DIAGNOSIS — M25531 Pain in right wrist: Secondary | ICD-10-CM | POA: Diagnosis not present

## 2021-12-19 DIAGNOSIS — S6981XD Other specified injuries of right wrist, hand and finger(s), subsequent encounter: Secondary | ICD-10-CM | POA: Diagnosis not present

## 2021-12-19 DIAGNOSIS — M25631 Stiffness of right wrist, not elsewhere classified: Secondary | ICD-10-CM | POA: Diagnosis not present

## 2022-01-05 DIAGNOSIS — M25631 Stiffness of right wrist, not elsewhere classified: Secondary | ICD-10-CM | POA: Diagnosis not present

## 2022-01-05 DIAGNOSIS — M25531 Pain in right wrist: Secondary | ICD-10-CM | POA: Diagnosis not present

## 2022-01-05 DIAGNOSIS — S6981XD Other specified injuries of right wrist, hand and finger(s), subsequent encounter: Secondary | ICD-10-CM | POA: Diagnosis not present

## 2022-01-11 DIAGNOSIS — M25531 Pain in right wrist: Secondary | ICD-10-CM | POA: Diagnosis not present

## 2022-01-11 DIAGNOSIS — M25631 Stiffness of right wrist, not elsewhere classified: Secondary | ICD-10-CM | POA: Diagnosis not present

## 2022-01-11 DIAGNOSIS — S6981XD Other specified injuries of right wrist, hand and finger(s), subsequent encounter: Secondary | ICD-10-CM | POA: Diagnosis not present

## 2022-02-02 DIAGNOSIS — M1812 Unilateral primary osteoarthritis of first carpometacarpal joint, left hand: Secondary | ICD-10-CM | POA: Diagnosis not present

## 2022-02-02 DIAGNOSIS — M24131 Other articular cartilage disorders, right wrist: Secondary | ICD-10-CM | POA: Diagnosis not present

## 2022-02-02 DIAGNOSIS — S6981XD Other specified injuries of right wrist, hand and finger(s), subsequent encounter: Secondary | ICD-10-CM | POA: Diagnosis not present

## 2022-02-02 DIAGNOSIS — M25531 Pain in right wrist: Secondary | ICD-10-CM | POA: Diagnosis not present

## 2022-03-05 DIAGNOSIS — S6981XD Other specified injuries of right wrist, hand and finger(s), subsequent encounter: Secondary | ICD-10-CM | POA: Diagnosis not present

## 2022-03-05 DIAGNOSIS — M1812 Unilateral primary osteoarthritis of first carpometacarpal joint, left hand: Secondary | ICD-10-CM | POA: Diagnosis not present

## 2022-03-23 IMAGING — MR MR WRIST*R* W/ CM
6 series · 40 of 40 positions shown · IV contrast (agent unspecified)
Comparison: None.

CLINICAL DATA: Extreme wrist/ulnar pain and weakness. Broke radius
09/28/2020. cast for 4 weeks and brace for 4 weeks

EXAM:
MRI OF THE RIGHT WRIST WITH CONTRAST (MR Arthrogram)
TECHNIQUE: Multiplanar, multisequence MR imaging of the wrist was performed
immediately following contrast injection into the radiocarpal joint
under fluoroscopic guidance. No intravenous contrast was
administered.

[Series 4: T1 fat-sat · axial · right · 3.0mm · 0.31mm/px · z∈[-100,-45]mm · 8 of 21 slices shown (1 of 2)]
[im 1/21]
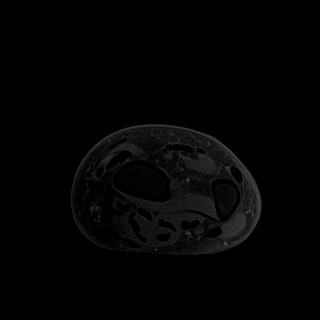
[im 3/21]
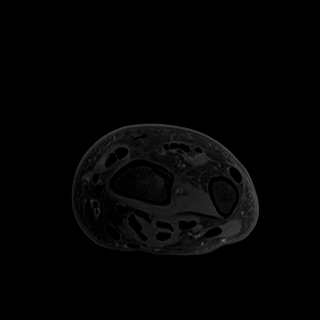
[im 6/21]
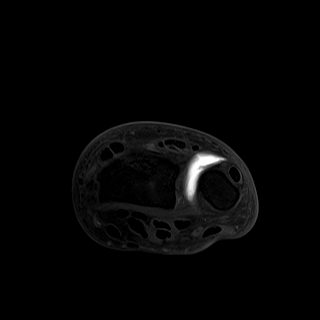
[im 9/21]
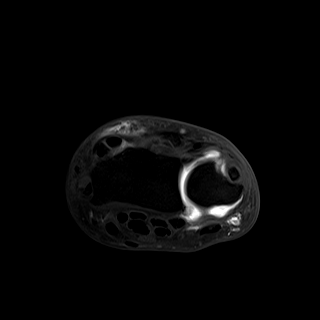
[im 12/21]
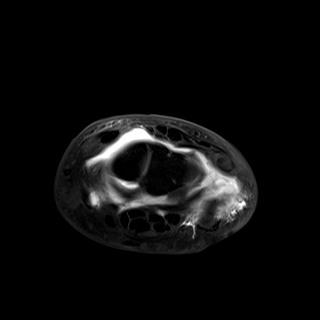
[im 15/21]
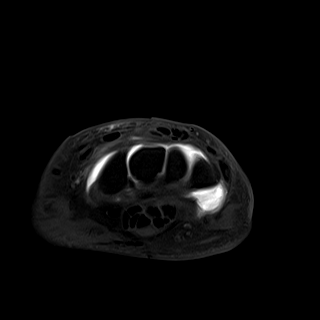
[im 18/21]
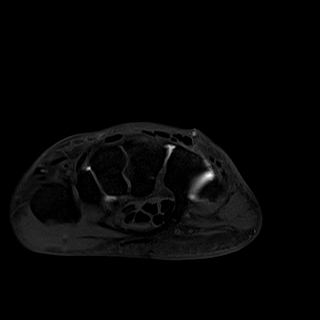
[im 21/21]
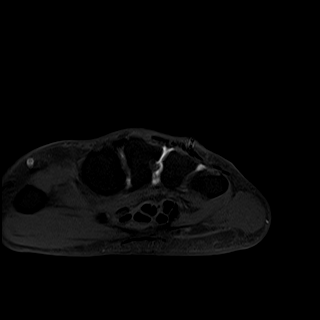

[Series 5: T2 fat-sat · axial · right · 3.0mm · 0.31mm/px · z∈[-100,-45]mm · 8 of 21 slices shown (1 of 3)]
[im 1/21]
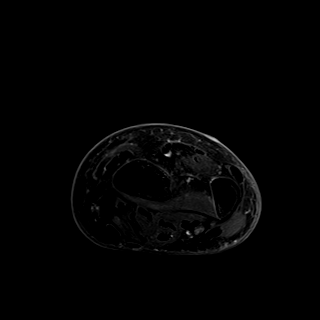
[im 3/21]
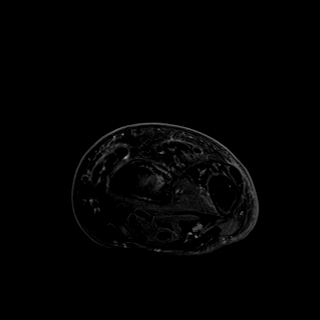
[im 6/21]
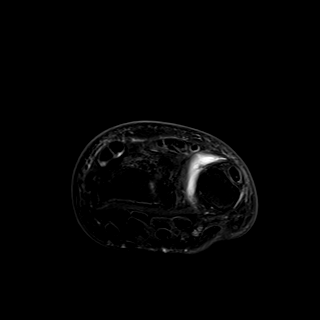
[im 9/21]
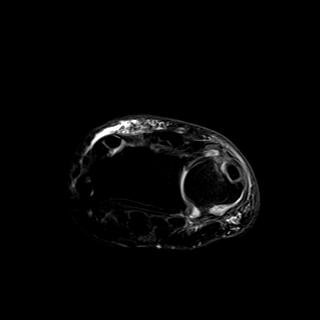
[im 12/21]
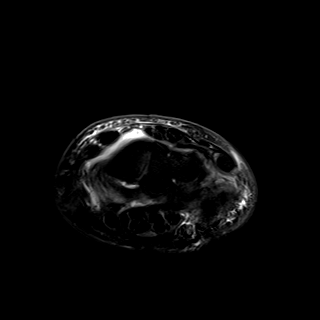
[im 15/21]
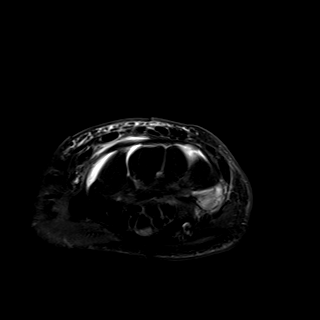
[im 18/21]
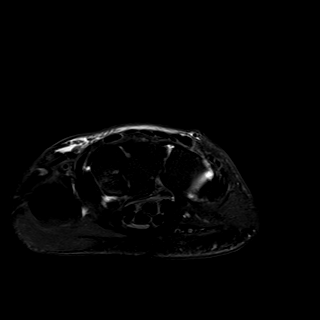
[im 21/21]
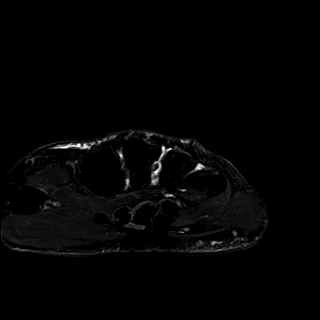

[Series 6: T1 fat-sat · coronal · right · 3.0mm · 0.31mm/px · 5 of 13 slices shown (2 of 2)]
[im 1/13]
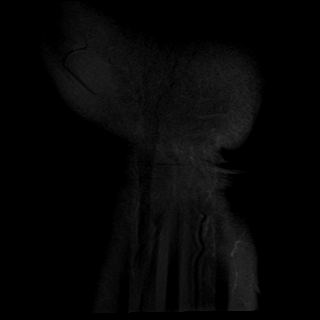
[im 4/13]
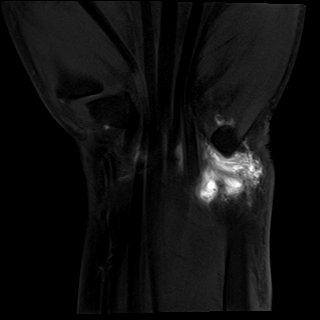
[im 7/13]
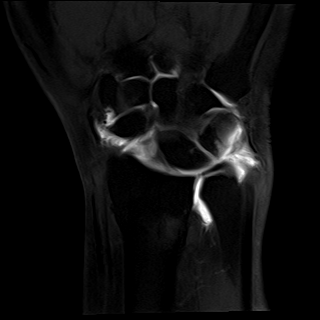
[im 10/13]
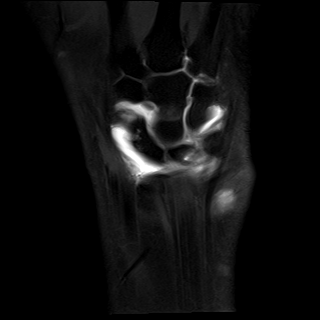
[im 13/13]
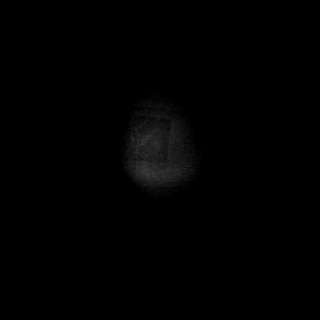

[Series 7: T1 · coronal · right · 3.0mm · 0.31mm/px · 5 of 13 slices shown]
[im 1/13]
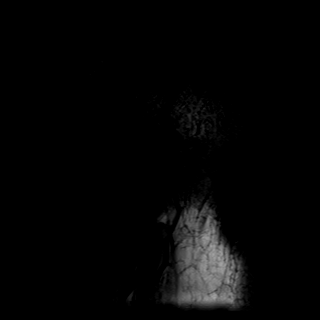
[im 4/13]
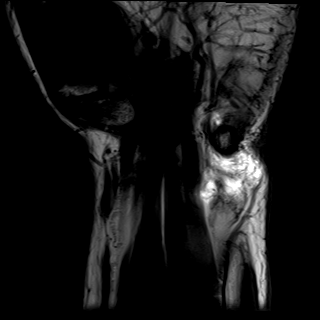
[im 7/13]
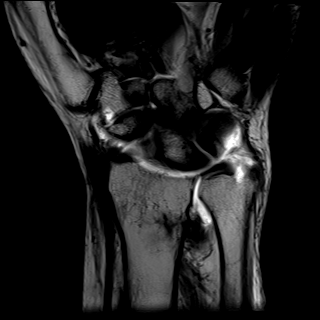
[im 10/13]
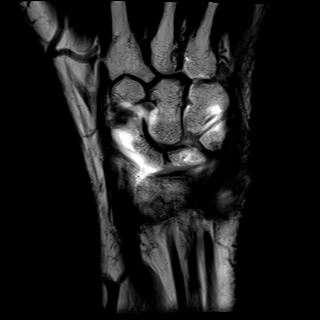
[im 13/13]
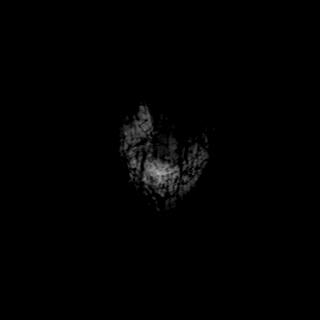

[Series 8: T2 fat-sat · coronal · right · 3.0mm · 0.31mm/px · 5 of 13 slices shown (2 of 3)]
[im 1/13]
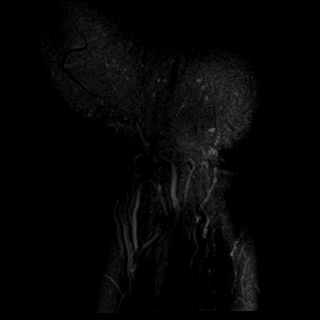
[im 4/13]
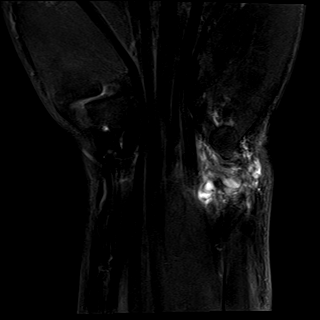
[im 7/13]
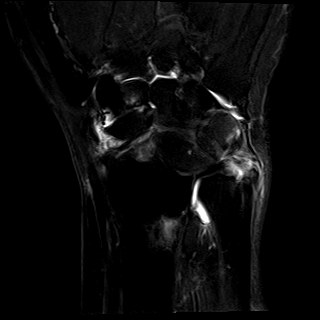
[im 10/13]
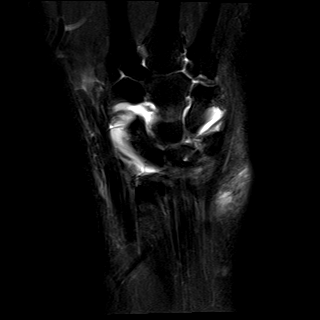
[im 13/13]
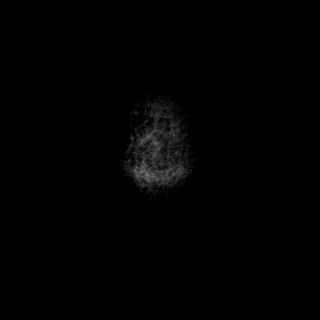

[Series 9: T2 fat-sat · sagittal · right · 3.0mm · 0.31mm/px · 9 of 23 slices shown (3 of 3)]
[im 1/23]
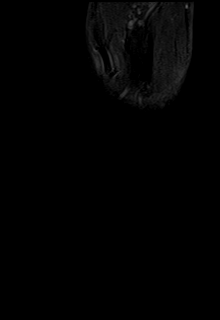
[im 3/23]
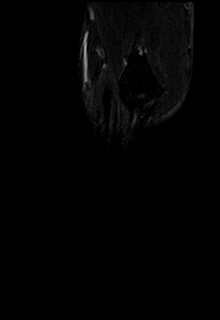
[im 6/23]
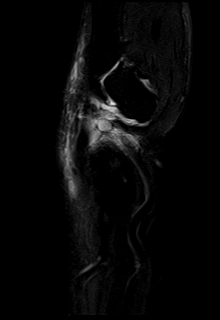
[im 9/23]
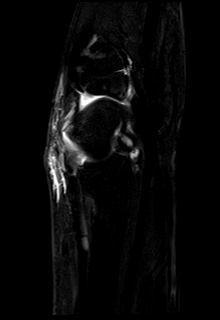
[im 12/23]
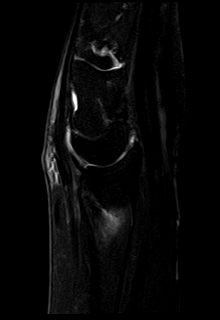
[im 14/23]
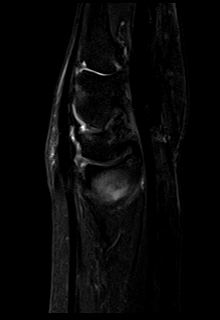
[im 17/23]
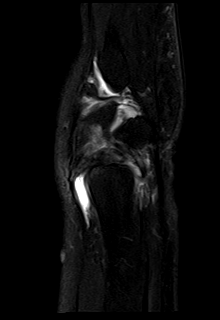
[im 20/23]
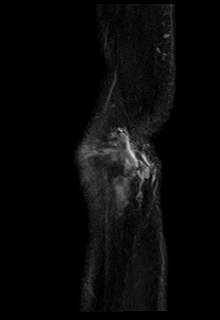
[im 23/23]
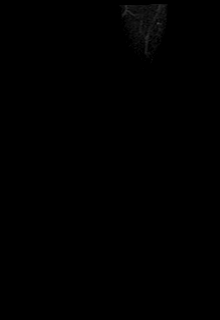

[40 of 40 positions shown; findings below may reference images not displayed]

FINDINGS: Ligaments: No evidence of scapholunate or lunotriquetral ligament
tear.

Triangular fibrocartilage: Central perforation of the TFCC articular
disc with contrast spilling into the DRUJ.

Tendons: There is mild tendinosis and tenosynovitis of the extensor
carpi ulnaris tendon. The other extensor tendons and flexor tendons
are unremarkable.

Carpal tunnel/median nerve: Flexor retinaculum is intact. Normal
carpal tunnel without a mass. Median nerve demonstrates normal
signal and caliber.

Guyon's canal: The ulnar nerve is normal. A small amount of contrast
spilled into the Guyon's canal.

Joint/cartilage: Mild radiocarpal and carpometacarpal joint
arthritis.

Bones/carpal alignment: Evidence of healing distal radial
metaphyseal fracture with residual fracture line and bony edema
along the ulnar aspect. The fracture is dorsally angulated. Evidence
of ulnar styloid injury with residual bony edema and indistinct
appearance of the foveal and styloid attachments of the TFCC.

Other: Muscles are normal. No fluid collection, hematoma, or soft
tissue mass.
IMPRESSION: Central perforation of the TFCC articular disc. Ulnar styloid injury
with residual bony edema and indistinct appearance of the foveal and
styloid attachments of the TFCC, likely disrupted/torn. Mild
extensor carpi ulnaris tendinosis and tenosynovitis.

Healing, dorsally angulated distal radial metaphyseal fracture with
residual fracture line and bony edema along the ulnar aspect.

No evidence of scapholunate or lunotriquetral ligament tear.

## 2022-03-23 IMAGING — XA DG FLUORO GUIDE NDL PLC/BX
1 series · 1 of 1 positions shown · non-contrast
Comparison: none

CLINICAL DATA: Wrist fracture.  Persistent pain.

[Series 1: ortho standard · 1 of 1 slices shown]
[im 1/1]
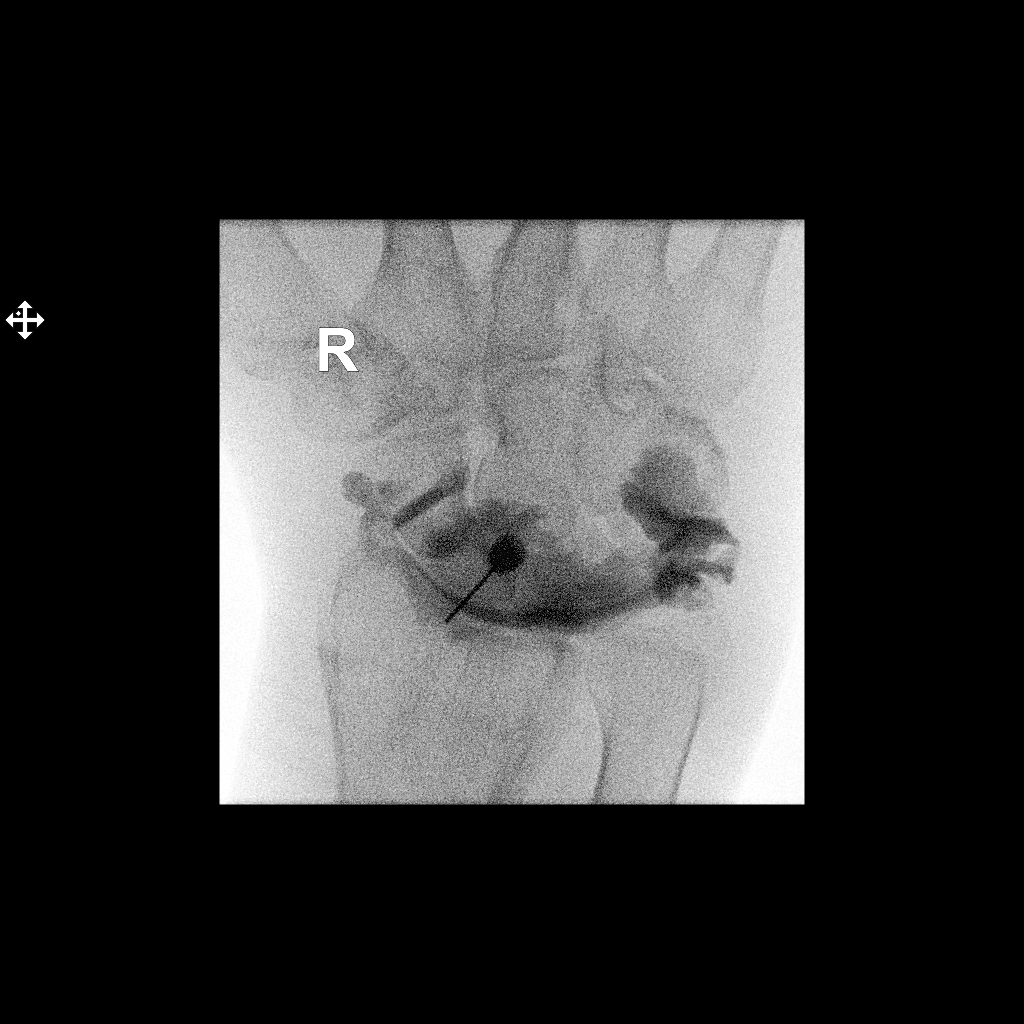

[1 of 1 positions shown; findings below may reference images not displayed]

FLUOROSCOPY TIME:  Radiation Exposure Index (as provided by the
fluoroscopic device): 0.26 uGy*m2

PROCEDURE:
Right WRIST INJECTION UNDER FLUOROSCOPY

An appropriate skin entrance site was determined. The site was
marked, prepped with Betadine, draped in the usual sterile fashion,
and infiltrated locally with 1% Lidocaine. A 25 gauge skin needle
was advanced into the radiocarpal joint under intermittent
fluoroscopy. A mixture of 0.1 ml Multihance and 20 ml of dilute
Isovue M 200 was then used to opacify the proximal carpal joint. No
immediate complication.
IMPRESSION: Technically successful right wrist injection for MRI.

## 2022-04-06 DIAGNOSIS — S6981XD Other specified injuries of right wrist, hand and finger(s), subsequent encounter: Secondary | ICD-10-CM | POA: Diagnosis not present

## 2022-04-06 DIAGNOSIS — M24131 Other articular cartilage disorders, right wrist: Secondary | ICD-10-CM | POA: Diagnosis not present

## 2022-04-06 DIAGNOSIS — M778 Other enthesopathies, not elsewhere classified: Secondary | ICD-10-CM | POA: Diagnosis not present

## 2022-04-06 DIAGNOSIS — M1812 Unilateral primary osteoarthritis of first carpometacarpal joint, left hand: Secondary | ICD-10-CM | POA: Diagnosis not present

## 2022-05-21 DIAGNOSIS — S6981XD Other specified injuries of right wrist, hand and finger(s), subsequent encounter: Secondary | ICD-10-CM | POA: Diagnosis not present

## 2022-05-21 DIAGNOSIS — M1812 Unilateral primary osteoarthritis of first carpometacarpal joint, left hand: Secondary | ICD-10-CM | POA: Diagnosis not present

## 2022-05-21 DIAGNOSIS — M24131 Other articular cartilage disorders, right wrist: Secondary | ICD-10-CM | POA: Diagnosis not present

## 2022-06-12 DIAGNOSIS — M1812 Unilateral primary osteoarthritis of first carpometacarpal joint, left hand: Secondary | ICD-10-CM | POA: Diagnosis not present

## 2022-06-12 DIAGNOSIS — S6981XD Other specified injuries of right wrist, hand and finger(s), subsequent encounter: Secondary | ICD-10-CM | POA: Diagnosis not present

## 2022-06-12 DIAGNOSIS — M24131 Other articular cartilage disorders, right wrist: Secondary | ICD-10-CM | POA: Diagnosis not present

## 2022-06-12 DIAGNOSIS — M778 Other enthesopathies, not elsewhere classified: Secondary | ICD-10-CM | POA: Diagnosis not present

## 2022-07-17 DIAGNOSIS — J208 Acute bronchitis due to other specified organisms: Secondary | ICD-10-CM | POA: Diagnosis not present

## 2022-08-16 DIAGNOSIS — J3089 Other allergic rhinitis: Secondary | ICD-10-CM | POA: Diagnosis not present

## 2022-10-09 DIAGNOSIS — Z13220 Encounter for screening for lipoid disorders: Secondary | ICD-10-CM | POA: Diagnosis not present

## 2022-10-09 DIAGNOSIS — R635 Abnormal weight gain: Secondary | ICD-10-CM | POA: Diagnosis not present

## 2022-10-09 DIAGNOSIS — R0602 Shortness of breath: Secondary | ICD-10-CM | POA: Diagnosis not present

## 2022-10-09 DIAGNOSIS — Z131 Encounter for screening for diabetes mellitus: Secondary | ICD-10-CM | POA: Diagnosis not present

## 2022-10-12 DIAGNOSIS — Z13220 Encounter for screening for lipoid disorders: Secondary | ICD-10-CM | POA: Diagnosis not present

## 2022-10-12 DIAGNOSIS — R0602 Shortness of breath: Secondary | ICD-10-CM | POA: Diagnosis not present

## 2022-10-12 DIAGNOSIS — R635 Abnormal weight gain: Secondary | ICD-10-CM | POA: Diagnosis not present

## 2022-10-12 DIAGNOSIS — Z131 Encounter for screening for diabetes mellitus: Secondary | ICD-10-CM | POA: Diagnosis not present

## 2022-10-18 ENCOUNTER — Other Ambulatory Visit (HOSPITAL_COMMUNITY): Payer: Self-pay

## 2022-10-18 DIAGNOSIS — R635 Abnormal weight gain: Secondary | ICD-10-CM

## 2022-11-02 ENCOUNTER — Ambulatory Visit (HOSPITAL_COMMUNITY): Payer: BC Managed Care – PPO

## 2022-11-28 ENCOUNTER — Other Ambulatory Visit: Payer: Self-pay | Admitting: Pediatrics

## 2022-11-28 DIAGNOSIS — Z1231 Encounter for screening mammogram for malignant neoplasm of breast: Secondary | ICD-10-CM

## 2022-12-07 ENCOUNTER — Ambulatory Visit (HOSPITAL_COMMUNITY)
Admission: RE | Admit: 2022-12-07 | Discharge: 2022-12-07 | Disposition: A | Discharge status: Home / Self Care | Payer: 59 | Source: Ambulatory Visit | Attending: Pain Medicine | Admitting: Pain Medicine

## 2022-12-07 DIAGNOSIS — R0602 Shortness of breath: Secondary | ICD-10-CM | POA: Insufficient documentation

## 2022-12-07 DIAGNOSIS — I517 Cardiomegaly: Secondary | ICD-10-CM | POA: Diagnosis not present

## 2022-12-07 DIAGNOSIS — R635 Abnormal weight gain: Secondary | ICD-10-CM

## 2022-12-07 LAB — ECHOCARDIOGRAM COMPLETE
AR max vel: 2.35 cm2
AV Area VTI: 2.58 cm2
AV Area mean vel: 2.43 cm2
AV Mean grad: 2 mmHg
AV Peak grad: 4.5 mmHg
Ao pk vel: 1.06 m/s
Area-P 1/2: 3.1 cm2
S' Lateral: 2.6 cm

## 2022-12-25 ENCOUNTER — Ambulatory Visit: Payer: 59 | Admitting: Cardiology

## 2022-12-25 ENCOUNTER — Encounter: Payer: Self-pay | Admitting: Cardiology

## 2022-12-25 VITALS — BP 141/75 | HR 87 | Resp 18 | Ht 64.0 in | Wt 177.0 lb

## 2022-12-25 DIAGNOSIS — R0609 Other forms of dyspnea: Secondary | ICD-10-CM

## 2022-12-25 DIAGNOSIS — E781 Pure hyperglyceridemia: Secondary | ICD-10-CM

## 2022-12-25 DIAGNOSIS — Z87891 Personal history of nicotine dependence: Secondary | ICD-10-CM

## 2022-12-25 DIAGNOSIS — I10 Essential (primary) hypertension: Secondary | ICD-10-CM

## 2022-12-25 DIAGNOSIS — E6609 Other obesity due to excess calories: Secondary | ICD-10-CM

## 2022-12-25 DIAGNOSIS — Z9884 Bariatric surgery status: Secondary | ICD-10-CM

## 2022-12-25 MED ORDER — LOSARTAN POTASSIUM 25 MG PO TABS: 25.0000 mg | ORAL_TABLET | ORAL | 0 refills | Status: DC

## 2022-12-25 NOTE — Progress Notes (Signed)
ID:  Jade Strickland, DOB 07/21/1961, MRN [id number]  PCP:  Kathyrn Lass, MD  Cardiologist:  Rex Kras, DO, [facility] (established care 12/25/2022)  REASON FOR CONSULT: Left ventricular hypertrophy and dyspnea  REQUESTING PHYSICIAN:  Kathyrn Lass, MD [place],  [place] [address]  Chief Complaint  Patient presents with   left ventricular hypertrophy    New Patient (Initial Visit)    HPI  Jade Strickland is a 62 y.o. .Caucasian female who presents to the clinic for evaluation of left ventricular hypertrophy and dyspnea at the request of Kathyrn Lass, MD. Her past medical history and cardiovascular risk factors include: Former smoker, obesity (hx of gastric bypass).   Patient was referred to the practice for evaluation of left ventricular hypertrophy and dyspnea on exertion.  Patient states Strickland the last 6 months has gained approximately 45 pounds likely due to dietary indiscretion and or decreased physical activity.  Her baseline weight is usually 130-135 pounds.  She has had a gastric bypass several years ago and has been successful in keeping the weight off.  Strickland the last 6 months when she started to notice weight gain she is also been more dyspneic.  Her dyspnea is predominantly with effort related activities but now she notes it with prolonged conversations as well.  She denies anginal chest pain at rest or with effort related activities.  Her primary care provider ordered an echocardiogram which notes preserved LVEF, grade 1 diastolic impairment, but the documented LVH was noted to be severe.  Overall image quality is limited due to breast implants/etc.  Therefore the accuracy is reduced.  Her blood pressure at today's office visit and prior visits have been elevated and she does not have a history of hypertension.  No premature coronary artery disease, sudden cardiac death, cardiomyopathy in the family.  FUNCTIONAL STATUS: No structured exercise program or daily  routine.   ALLERGIES: Allergies  Allergen Reactions   Hydrocodone Nausea And Vomiting   Nsaids     Hx of gastric bypass   Sulfa Antibiotics Hives    MEDICATION LIST PRIOR TO VISIT: Current Meds  Medication Sig   albuterol (VENTOLIN HFA) 108 (90 Base) MCG/ACT inhaler Inhale 2 puffs into the lungs every 4 (four) hours as needed for shortness of breath.   Ascorbic Acid (VITAMIN C ER PO) Take by mouth.   Calcium 200 MG TABS Take by mouth.   Ferrous Sulfate (FER-IRON PO) Take by mouth.   losartan (COZAAR) 25 MG tablet Take 1 tablet (25 mg total) by mouth every morning.   Multiple Vitamins-Minerals (HAIR SKIN AND NAILS FORMULA PO) Take by mouth.   Multiple Vitamins-Minerals (MULTIVITAMIN WITH MINERALS) tablet Take 1 tablet by mouth daily.   oxymetazoline (AFRIN) 0.05 % nasal spray Place 1 spray into both nostrils 2 (two) times daily as needed for congestion.   tirzepatide [facility]) 2.5 MG/0.5ML Pen Inject 2.5 mg into the skin once a week.   VITAMIN D, CHOLECALCIFEROL, PO Take by mouth.     PAST MEDICAL HISTORY: Past Medical History:  Diagnosis Date   Complication of anesthesia    GERD (gastroesophageal reflux disease)    PONV (postoperative nausea and vomiting)     PAST SURGICAL HISTORY: Past Surgical History:  Procedure Laterality Date   ABDOMINOPLASTY/PANNICULECTOMY  2005   CHOLECYSTECTOMY     GASTRIC BYPASS  1998   MANDIBLE FRACTURE SURGERY  2001   upper   PLACEMENT OF BREAST IMPLANTS     TONSILLECTOMY  TOTAL HIP ARTHROPLASTY N/A 03/03/2019   Procedure: TOTAL HIP ARTHROPLASTY ANTERIOR APPROACH WITH LEFT HIP INTRAARTICULAR INJECTION;  Surgeon: Paralee Cancel, MD;  Location: [facility];  Service: Orthopedics;  Laterality: N/A;  70 mins   WRIST ARTHROSCOPY WITH FOVEAL TRIANGULAR FIBROCARTILAGE COMPLEX REPAIR Right 11/02/2021   Procedure: ARTHROSCOPY RIGHT WRIST DEBRIDEMENT WITH TRIANGULAR FIBROCARTILAGE COMPLEX REPAIR;  Surgeon: Daryll Brod, MD;  Location: [facility];  Service: Orthopedics;  Laterality: Right;   WRIST FUSION Left 1985    FAMILY HISTORY: The patient family history includes Breast cancer in her mother; Dementia in her father.  SOCIAL HISTORY:  The patient  reports that she quit smoking about 10 years ago. Her smoking use included cigarettes. She has a 30.00 pack-year smoking history. She has never used smokeless tobacco. She reports current alcohol use. She reports that she does not use drugs.  REVIEW OF SYSTEMS: Review of Systems  Constitutional: Positive for malaise/fatigue.  Cardiovascular:  Positive for dyspnea on exertion. Negative for chest pain, claudication, irregular heartbeat, leg swelling, near-syncope, orthopnea, palpitations, paroxysmal nocturnal dyspnea and syncope.  Hematologic/Lymphatic: Negative for bleeding problem.  Musculoskeletal:  Negative for muscle cramps and myalgias.  Neurological:  Negative for dizziness and light-headedness.    PHYSICAL EXAM:    12/25/2022    8:43 AM 11/02/2021    3:30 PM 11/02/2021    3:15 PM  Vitals with BMI  Height '5\' 4"'$     Weight 177 lbs    BMI 123456    Systolic [id number]  Diastolic 75 59 81  Pulse 87 67 49    Physical Exam  Constitutional: No distress.  Age appropriate, hemodynamically stable.   Neck: No JVD present.  Cardiovascular: Normal rate, regular rhythm, S1 normal, S2 normal, intact distal pulses and normal pulses. Exam reveals no gallop, no S3 and no S4.  No murmur heard. Pulmonary/Chest: Effort normal and breath sounds normal. No stridor. She has no wheezes. She has no rales.  Abdominal: Soft. Bowel sounds are normal. She exhibits no distension. There is no abdominal tenderness.  Musculoskeletal:        General: Edema (Trace bilateral at the ankle) present.     Cervical back: Neck supple.  Neurological: She is alert and oriented to person, place, and time. She has intact cranial nerves (2-12).  Skin: Skin is warm and moist.   CARDIAC  DATABASE: EKG: 12/25/2022: Sinus rhythm, 83 bpm, without underlying ischemia or injury pattern.  Echocardiogram: 12/07/2022:  1. Left ventricular ejection fraction, by estimation, is 60 to 65%. The  left ventricle has normal function. The left ventricle has no regional  wall motion abnormalities. There is severe asymmetric left ventricular  hypertrophy. Left ventricular diastolic   parameters are consistent with Grade I diastolic dysfunction (impaired  relaxation).   2. Right ventricular systolic function is normal. The right ventricular  size is normal.   3. The mitral valve is normal in structure. No evidence of mitral valve  regurgitation. No evidence of mitral stenosis.   4. The aortic valve is normal in structure. Aortic valve regurgitation is  not visualized. No aortic stenosis is present.   5. There is mild dilatation of the ascending aorta, measuring 39 mm.   6. The inferior vena cava is normal in size with greater than 50%  respiratory variability, suggesting right atrial pressure of 3 mmHg.     Stress Testing: No results found for this or any previous visit from the past 1095 days.   Heart Catheterization:  None  LABORATORY DATA: External Labs: Collected: 11/27/2022 provided by referring physician. TSH 5.33 Free T4 0.75 Total iron binding capacity 616 mcg/dL (H) Iron 49 mcg/dL.(L) Iron saturation 8%(L) Transferrin 440 mg/dL (H)  External Labs: Collected: 10/16/2022 available in [place] under [facility] physicians. Total cholesterol 149, HDL 65, triglycerides 313, LDL 79, non-HDL 129. Hemoglobin 11.2 g/dL, hematocrit 35.7%. AST 22, ALT 12, alkaline phosphatase 138. Sodium 138, potassium 4.1, chloride 106, bicarb 21. BUN 12, creatinine 0.72.      Latest Ref Rng & Units 03/04/2019    3:29 AM 02/27/2019    2:02 PM  CBC  WBC 4.0 - 10.5 K/uL 9.8  6.3   Hemoglobin 12.0 - 15.0 g/dL 10.2  12.5   Hematocrit 36.0 - 46.0 % 33.0  40.3   Platelets 150 - 400 K/uL 206   258        Latest Ref Rng & Units 03/04/2019    3:29 AM 02/27/2019    2:02 PM  CMP  Glucose 70 - 99 mg/dL 116  97   BUN 6 - 20 mg/dL 10  18   Creatinine 0.44 - 1.00 mg/dL 0.50  0.66   Sodium 135 - 145 mmol/L 138  139   Potassium 3.5 - 5.1 mmol/L 4.1  4.1   Chloride 98 - 111 mmol/L 107  106   CO2 22 - 32 mmol/L 24  25   Calcium 8.9 - 10.3 mg/dL 8.8  9.1     Lipid Panel  No results found for: "CHOL", "TRIG", "HDL", "CHOLHDL", "VLDL", "LDLCALC", "LDLDIRECT", "LABVLDL"  No components found for: "NTPROBNP" No results for input(s): "PROBNP" in the last 8760 hours. No results for input(s): "TSH" in the last 8760 hours.  BMP No results for input(s): "NA", "K", "CL", "CO2", "GLUCOSE", "BUN", "CREATININE", "CALCIUM", "GFRNONAA", "GFRAA" in the last 8760 hours.  HEMOGLOBIN A1C No results found for: "HGBA1C", "MPG"  IMPRESSION:    ICD-10-CM   1. Dyspnea on exertion  R06.09 EKG 12-Lead    losartan (COZAAR) 25 MG tablet    Pro b natriuretic peptide (BNP)    Basic metabolic panel    Magnesium    PCV CARDIAC STRESS TEST    CT CARDIAC SCORING ([facility] LOCATIONS ONLY)    2. Benign hypertension  I10 losartan (COZAAR) 25 MG tablet    Pro b natriuretic peptide (BNP)    Basic metabolic panel    Magnesium    3. Hypertriglyceridemia  E78.1     4. Former smoker  Z87.891     5. Class 1 obesity due to excess calories without serious comorbidity with body mass index (BMI) of 30.0 to 30.9 in adult  E66.09    Z68.30     6. Hx of gastric bypass  Z98.84        RECOMMENDATIONS: Greidis Mountford is a 62 y.o. Caucasian female whose past medical history and cardiac risk factors include: Former smoker, obesity (hx of gastric bypass).   Dyspnea on exertion Ongoing for the last 6 months. Has gained approximately 45 pounds due to dietary indiscretion and reduced physical activity due to recent hip replacement, underlying anemia, undiagnosed hypertension. Will start losartan 25 mg p.o. every morning.   And labs in 1 week including NT proBNP Echocardiogram from February 2024 independently reviewed -LVEF is preserved, grade 1 diastolic impairment, she does have LVH likely moderate and not severe.  Overall diagnostic images were limited due to breast implants. Patient is asked to follow-up with primary care or her gastroenterologist for  evaluation of iron deficiency anemia patient with a history of gastric bypass. From ischemic standpoint we will proceed with a coronary calcium score and exercise treadmill stress test to evaluate for evaluation of plaque burden and functional capacity/exercise-induced ischemia respectively. Overall probability of HCM is low; however, if she continues to be dyspneic despite the above-mentioned interventions could consider cardiac MRI for further risk stratification. Overall probability for pulm hypertension is low, right ventricular systolic function and size within acceptable limits, and RVSP within normal limits.  Benign hypertension Office blood pressures are currently not at goal. Initially appears hesitant to be on antihypertensive medications. Therefore we will start her on low-dose losartan 25 mg p.o. every morning I suspect that she will need to antihypertensive agents in the long run at least until her weight comes back to baseline. Labs in 1 week after being on antihypertensive medications to evaluate renal function and potassium  Hypertriglyceridemia Labs from December 2023 notes triglyceride levels to be 313 mg/dL. Patient states that she could do better with reducing her alcohol intake, and dietary foods that are high in triglycerides. She will make lifestyle changes and have it reevaluated with PCP in 6 weeks. Will monitor for now  Former smoker Reemphasized importance of continued complete smoking cessation  Class 1 obesity due to excess calories without serious comorbidity with body mass index (BMI) of 30.0 to 30.9 in adult Hx of gastric  bypass Body mass index is 30.38 kg/m. Has gained approximately 45 pounds Strickland the last 6 months. Just started [facility] earlier this week I reviewed with her importance of diet, regular physical activity/exercise, weight loss.   Patient is educated on the importance of increasing physical activity gradually as tolerated with a goal of moderate intensity exercise for 30 minutes a day 5 days a week.  FINAL MEDICATION LIST END OF ENCOUNTER: Meds ordered this encounter  Medications   losartan (COZAAR) 25 MG tablet    Sig: Take 1 tablet (25 mg total) by mouth every morning.    Dispense:  30 tablet    Refill:  0    Medications Discontinued During This Encounter  Medication Reason   acetaminophen (TYLENOL) 500 MG tablet    docusate sodium (COLACE) 100 MG capsule    lansoprazole (PREVACID) 15 MG capsule      Current Outpatient Medications:    albuterol (VENTOLIN HFA) 108 (90 Base) MCG/ACT inhaler, Inhale 2 puffs into the lungs every 4 (four) hours as needed for shortness of breath., Disp: , Rfl:    Ascorbic Acid (VITAMIN C ER PO), Take by mouth., Disp: , Rfl:    Calcium 200 MG TABS, Take by mouth., Disp: , Rfl:    Ferrous Sulfate (FER-IRON PO), Take by mouth., Disp: , Rfl:    losartan (COZAAR) 25 MG tablet, Take 1 tablet (25 mg total) by mouth every morning., Disp: 30 tablet, Rfl: 0   Multiple Vitamins-Minerals (HAIR SKIN AND NAILS FORMULA PO), Take by mouth., Disp: , Rfl:    Multiple Vitamins-Minerals (MULTIVITAMIN WITH MINERALS) tablet, Take 1 tablet by mouth daily., Disp: , Rfl:    oxymetazoline (AFRIN) 0.05 % nasal spray, Place 1 spray into both nostrils 2 (two) times daily as needed for congestion., Disp: , Rfl:    tirzepatide (MOUNJARO) 2.5 MG/0.5ML Pen, Inject 2.5 mg into the skin once a week., Disp: , Rfl:    VITAMIN D, CHOLECALCIFEROL, PO, Take by mouth., Disp: , Rfl:   Orders Placed This Encounter  Procedures   CT CARDIAC SCORING ([facility] LOCATIONS  ONLY)   Pro b natriuretic peptide  (BNP)   Basic metabolic panel   Magnesium   PCV CARDIAC STRESS TEST   EKG 12-Lead    There are no Patient Instructions on file for this visit.   --Continue cardiac medications as reconciled in final medication list. --Return in about 6 weeks (around 02/05/2023) for Follow up, Dyspnea, Review test results. or sooner if needed. --Continue follow-up with your primary care physician regarding the management of your other chronic comorbid conditions.  Patient's questions and concerns were addressed to her satisfaction. She voices understanding of the instructions provided during this encounter.   This note was created using a voice recognition software as a result there may be grammatical errors inadvertently enclosed that do not reflect the nature of this encounter. Every attempt is made to correct such errors.  Rex Kras, [place], [facility]  Pager:  [phone] Office: [phone]

## 2022-12-26 ENCOUNTER — Other Ambulatory Visit: Payer: Self-pay

## 2022-12-26 DIAGNOSIS — R0609 Other forms of dyspnea: Secondary | ICD-10-CM

## 2022-12-26 DIAGNOSIS — I10 Essential (primary) hypertension: Secondary | ICD-10-CM

## 2023-01-03 LAB — BASIC METABOLIC PANEL
BUN/Creatinine Ratio: 24 — ABNORMAL HIGH (ref 9–23)
BUN: 16 mg/dL (ref 6–24)
CO2: 21 mmol/L (ref 20–29)
Calcium: 9.5 mg/dL (ref 8.7–10.2)
Chloride: 105 mmol/L (ref 96–106)
Creatinine, Ser: 0.66 mg/dL (ref 0.57–1.00)
Glucose: 93 mg/dL (ref 70–99)
Potassium: 4.2 mmol/L (ref 3.5–5.2)
Sodium: 143 mmol/L (ref 134–144)
eGFR: 101 mL/min/{1.73_m2} (ref >59)

## 2023-01-03 LAB — PRO B NATRIURETIC PEPTIDE: NT-Pro BNP: 181 pg/mL (ref 0–287)

## 2023-01-03 LAB — MAGNESIUM: Magnesium: 1.9 mg/dL (ref 1.6–2.3)

## 2023-01-17 ENCOUNTER — Ambulatory Visit: Payer: 59

## 2023-01-17 DIAGNOSIS — R0609 Other forms of dyspnea: Secondary | ICD-10-CM

## 2023-01-20 LAB — PCV CARDIAC STRESS TEST
Angina Index: 0
ST Depression (mm): 0 mm

## 2023-01-21 ENCOUNTER — Other Ambulatory Visit: Payer: Self-pay | Admitting: Cardiology

## 2023-01-21 DIAGNOSIS — I10 Essential (primary) hypertension: Secondary | ICD-10-CM

## 2023-01-21 DIAGNOSIS — R0609 Other forms of dyspnea: Secondary | ICD-10-CM

## 2023-01-22 ENCOUNTER — Other Ambulatory Visit: Payer: 59

## 2023-01-28 ENCOUNTER — Other Ambulatory Visit: Payer: Self-pay | Admitting: Orthopedic Surgery

## 2023-01-28 DIAGNOSIS — M79645 Pain in left finger(s): Secondary | ICD-10-CM

## 2023-02-05 ENCOUNTER — Ambulatory Visit: Payer: 59 | Admitting: Cardiology

## 2023-02-05 ENCOUNTER — Other Ambulatory Visit: Payer: Self-pay

## 2023-02-05 ENCOUNTER — Encounter: Payer: Self-pay | Admitting: Cardiology

## 2023-02-05 VITALS — BP 146/80 | HR 71 | Resp 16 | Ht 64.0 in | Wt 169.4 lb

## 2023-02-05 DIAGNOSIS — Z87891 Personal history of nicotine dependence: Secondary | ICD-10-CM

## 2023-02-05 DIAGNOSIS — R0609 Other forms of dyspnea: Secondary | ICD-10-CM

## 2023-02-05 DIAGNOSIS — I10 Essential (primary) hypertension: Secondary | ICD-10-CM

## 2023-02-05 DIAGNOSIS — E781 Pure hyperglyceridemia: Secondary | ICD-10-CM

## 2023-02-05 DIAGNOSIS — Z9884 Bariatric surgery status: Secondary | ICD-10-CM

## 2023-02-05 DIAGNOSIS — E6609 Other obesity due to excess calories: Secondary | ICD-10-CM

## 2023-02-05 DIAGNOSIS — R931 Abnormal findings on diagnostic imaging of heart and coronary circulation: Secondary | ICD-10-CM

## 2023-02-05 DIAGNOSIS — I517 Cardiomegaly: Secondary | ICD-10-CM

## 2023-02-05 MED ORDER — LOSARTAN POTASSIUM 50 MG PO TABS: 50.0000 mg | ORAL_TABLET | Freq: Every day | ORAL | 0 refills | Status: DC

## 2023-02-05 MED ORDER — CHLORTHALIDONE 25 MG PO TABS: 25.0000 mg | ORAL_TABLET | Freq: Every morning | ORAL | 0 refills | Status: AC

## 2023-02-05 NOTE — Progress Notes (Signed)
ID:  Jade Strickland, DOB [birth year], MRN [id number]  PCP:  Sigmund Hazel, MD  Cardiologist:  Tessa Lerner, DO, [facility] (established care 12/25/2022)  Date: 02/05/23 Last Office Visit: 12/25/2022  Chief Complaint  Patient presents with   Shortness of Breath   Follow-up    6 weeks    HPI  Jade Strickland is a 62 y.o. .Caucasian female whose past medical history and cardiovascular risk factors include: Hypertension, hypertriglyceridemia, mild coronary artery calcification, former smoker, obesity (hx of gastric bypass).   Referred to the practice for evaluation of left ventricular hypertrophy and dyspnea on exertion.  At last office visit she was started on losartan and her home blood pressure still remains elevated at 140/80 mmHg.  Given her dyspnea she did undergo coronary calcium score which noted mild CAC and GXT was suboptimal and she achieved 84% of age-predicted maximal heart rate, hypertensive response was accelerated, and she experienced dyspnea on exertion.  Other cardiovascular risk factors include hypertriglyceridemia.  She is working on lifestyle changes to help facilitate weight loss and she is also on Mounjaro.  She wants to avoid additional pharmacological therapy as possible.  During initial consultation she noted approximately 45 pounds of weight gain due to dietary indiscretion and decreased physical activity (baseline weight is 130-135 pounds).  She has an appointment with PCP next month to reevaluate her anemia and lipids according to the patient.  No premature coronary artery disease, sudden cardiac death, cardiomyopathy in the family.  FUNCTIONAL STATUS: No structured exercise program or daily routine.   ALLERGIES: Allergies  Allergen Reactions   Hydrocodone Nausea And Vomiting   Nsaids     Hx of gastric bypass   Sulfa Antibiotics Hives    MEDICATION LIST PRIOR TO VISIT: Current Meds  Medication Sig   albuterol (VENTOLIN HFA) 108 (90 Base) MCG/ACT inhaler  Inhale 2 puffs into the lungs every 4 (four) hours as needed for shortness of breath.   Ascorbic Acid (VITAMIN C ER PO) Take by mouth.   Calcium 200 MG TABS Take by mouth.   chlorthalidone (HYGROTON) 25 MG tablet Take 1 tablet (25 mg total) by mouth every morning.   Ferrous Sulfate (FER-IRON PO) Take by mouth.   Multiple Vitamins-Minerals (HAIR SKIN AND NAILS FORMULA PO) Take by mouth.   Multiple Vitamins-Minerals (MULTIVITAMIN WITH MINERALS) tablet Take 1 tablet by mouth daily.   oxymetazoline (AFRIN) 0.05 % nasal spray Place 1 spray into both nostrils 2 (two) times daily as needed for congestion.   tirzepatide [facility]) 5 MG/0.5ML Pen Inject 5 mg into the skin once a week.   VITAMIN D, CHOLECALCIFEROL, PO Take by mouth.   [DISCONTINUED] losartan (COZAAR) 25 MG tablet TAKE 1 TABLET BY MOUTH EVERY MORNING     PAST MEDICAL HISTORY: Past Medical History:  Diagnosis Date   Complication of anesthesia    Coronary artery calcification    GERD (gastroesophageal reflux disease)    Hypertriglyceridemia    PONV (postoperative nausea and vomiting)     PAST SURGICAL HISTORY: Past Surgical History:  Procedure Laterality Date   ABDOMINOPLASTY/PANNICULECTOMY  2005   CHOLECYSTECTOMY     GASTRIC BYPASS  1998   MANDIBLE FRACTURE SURGERY  2001   upper   PLACEMENT OF BREAST IMPLANTS     TONSILLECTOMY     TOTAL HIP ARTHROPLASTY N/A 03/03/2019   Procedure: TOTAL HIP ARTHROPLASTY ANTERIOR APPROACH WITH LEFT HIP INTRAARTICULAR INJECTION;  Surgeon: Durene Romans, MD;  Location: [facility];  Service: Orthopedics;  Laterality: N/A;  70  mins   WRIST ARTHROSCOPY WITH FOVEAL TRIANGULAR FIBROCARTILAGE COMPLEX REPAIR Right 11/02/2021   Procedure: ARTHROSCOPY RIGHT WRIST DEBRIDEMENT WITH TRIANGULAR FIBROCARTILAGE COMPLEX REPAIR;  Surgeon: Cindee Salt, MD;  Location: [facility];  Service: Orthopedics;  Laterality: Right;   WRIST FUSION Left 1985    FAMILY HISTORY: The patient family history  includes Breast cancer in her mother; Dementia in her father.  SOCIAL HISTORY:  The patient  reports that she quit smoking about 10 years ago. Her smoking use included cigarettes. She has a 30.00 pack-year smoking history. She has never used smokeless tobacco. She reports current alcohol use. She reports that she does not use drugs.  REVIEW OF SYSTEMS: Review of Systems  Constitutional: Positive for malaise/fatigue.  Cardiovascular:  Positive for dyspnea on exertion. Negative for chest pain, claudication, irregular heartbeat, leg swelling, near-syncope, orthopnea, palpitations, paroxysmal nocturnal dyspnea and syncope.  Hematologic/Lymphatic: Negative for bleeding problem.  Musculoskeletal:  Negative for muscle cramps and myalgias.  Neurological:  Negative for dizziness and light-headedness.    PHYSICAL EXAM:    02/05/2023   12:03 PM 12/25/2022    8:43 AM 11/02/2021    3:30 PM  Vitals with BMI  Height     Weight 169 lbs 6 oz 177 lbs   BMI 29.06 30.37   Systolic 146 141 621  Diastolic 80 75 59  Pulse 71 87 67    Physical Exam  Constitutional: No distress.  Age appropriate, hemodynamically stable.   Neck: No JVD present.  Cardiovascular: Normal rate, regular rhythm, S1 normal, S2 normal, intact distal pulses and normal pulses. Exam reveals no gallop, no S3 and no S4.  No murmur heard. Pulmonary/Chest: Effort normal and breath sounds normal. No stridor. She has no wheezes. She has no rales.  Abdominal: Soft. Bowel sounds are normal. She exhibits no distension. There is no abdominal tenderness.  Musculoskeletal:        General: Edema (Trace bilateral at the ankle) present.     Cervical back: Neck supple.  Neurological: She is alert and oriented to person, place, and time. She has intact cranial nerves (2-12).  Skin: Skin is warm and moist.   CARDIAC DATABASE: EKG: 12/25/2022: Sinus rhythm, 83 bpm, without underlying ischemia or injury  pattern.  Echocardiogram: 12/07/2022:  1. Left ventricular ejection fraction, by estimation, is 60 to 65%. The  left ventricle has normal function. The left ventricle has no regional  wall motion abnormalities. There is severe asymmetric left ventricular  hypertrophy. Left ventricular diastolic   parameters are consistent with Grade I diastolic dysfunction (impaired  relaxation).   2. Right ventricular systolic function is normal. The right ventricular  size is normal.   3. The mitral valve is normal in structure. No evidence of mitral valve  regurgitation. No evidence of mitral stenosis.   4. The aortic valve is normal in structure. Aortic valve regurgitation is  not visualized. No aortic stenosis is present.   5. There is mild dilatation of the ascending aorta, measuring 39 mm.   6. The inferior vena cava is normal in size with greater than 50%  respiratory variability, suggesting right atrial pressure of 3 mmHg.     Stress Testing: Treadmill Exercise Stress 01/16/2023:  Stress EKG negative for ischemia. Patient exercised for 3 minutes and 57 seconds on Bruce protocol; achieved 5.78 METs at 84% MPHR. Chest pain is not present. Exercise capacity is reduced for age.  No arrhythmias noted on ECG at stress. The heart rate response  was accelerated.  The blood pressure response was normal. The baseline blood pressure was 142/80 mmHg and increased to 190/90 mmHg at peak exercise.  Heart Catheterization: None  CT Cardiac Scoring: 01/11/2023 at [facility] - Total Calcium Score: 25.  -- Left Main: 0.  -- Left Anterior Descending: 25.  -- Circumflex: 0.  -- Right Coronary: 0  -- Posterior Descending Artery: 0  Total CAC 25 AU, 34th  percentile for patient's age, sex, and race. Noncardiac findings: - Cardiovascular:  Normal heart size. No pericardial effusion. Visualized thoracic aorta is normal in caliber. Main pulmonary artery is normal in caliber.  - Mediastinum: No adenopathy.   - Visible abdomen: Hiatal hernia and postop change of the GE junction.  - Visible lung fields: No significant abnormality.  - Miscellaneous: Breast implants, scoliosis, cholecystectomy clips.    LABORATORY DATA: External Labs: Collected: 11/27/2022 provided by referring physician. TSH 5.33 Free T4 0.75 Total iron binding capacity 616 mcg/dL (H) Iron 49 mcg/dL.(L) Iron saturation 8%(L) Transferrin 440 mg/dL (H)  External Labs: Collected: 10/16/2022 available in Care Everywhere under [facility] physicians. Total cholesterol 149, HDL 65, triglycerides 313, LDL 79, non-HDL 129. Hemoglobin 11.2 g/dL, hematocrit 40.9%. AST 22, ALT 12, alkaline phosphatase 138. Sodium 138, potassium 4.1, chloride 106, bicarb 21. BUN 12, creatinine 0.72.      Latest Ref Rng & Units 03/04/2019    3:29 AM 02/27/2019    2:02 PM  CBC  WBC 4.0 - 10.5 K/uL 9.8  6.3   Hemoglobin 12.0 - 15.0 g/dL 81.1  91.4   Hematocrit 36.0 - 46.0 % 33.0  40.3   Platelets 150 - 400 K/uL 206  258        Latest Ref Rng & Units 01/02/2023    8:44 AM 03/04/2019    3:29 AM 02/27/2019    2:02 PM  CMP  Glucose 70 - 99 mg/dL 93  782  97   BUN 6 - 24 mg/dL 16  10  18    Creatinine 0.57 - 1.00 mg/dL 9.56  2.13  0.86   Sodium 134 - 144 mmol/L 143  138  139   Potassium 3.5 - 5.2 mmol/L 4.2  4.1  4.1   Chloride 96 - 106 mmol/L 105  107  106   CO2 20 - 29 mmol/L 21  24  25    Calcium 8.7 - 10.2 mg/dL 9.5  8.8  9.1     Lipid Panel  No results found for: "CHOL", "TRIG", "HDL", "CHOLHDL", "VLDL", "LDLCALC", "LDLDIRECT", "LABVLDL"  No components found for: "NTPROBNP" Recent Labs    01/02/23 0844  PROBNP 181   No results for input(s): "TSH" in the last 8760 hours.  BMP Recent Labs    01/02/23 0844  NA 143  K 4.2  CL 105  CO2 21  GLUCOSE 93  BUN 16  CREATININE 0.66  CALCIUM 9.5    HEMOGLOBIN A1C No results found for: "HGBA1C", "MPG"  IMPRESSION:    ICD-10-CM   1. Dyspnea on exertion  R06.09 chlorthalidone (HYGROTON)  25 MG tablet    losartan (COZAAR) 50 MG tablet    Hemoglobin and hematocrit, blood    Basic metabolic panel    MR CARDIAC STRESS TEST    2. Agatston coronary artery calcium score less than 100  R93.1 MR CARDIAC STRESS TEST    3. Left ventricular hypertrophy  I51.7 MR CARDIAC STRESS TEST    4. Benign hypertension  I10 chlorthalidone (HYGROTON) 25 MG tablet    losartan (  COZAAR) 50 MG tablet    Hemoglobin and hematocrit, blood    Basic metabolic panel    MR CARDIAC STRESS TEST    5. Hypertriglyceridemia  E78.1     6. Former smoker  Z87.891     7. Class 1 obesity due to excess calories without serious comorbidity with body mass index (BMI) of 30.0 to 30.9 in adult  E66.09    Z68.30     8. Hx of gastric bypass  Z98.84        RECOMMENDATIONS: Diya Gervasi is a 62 y.o. Caucasian female whose past medical history and cardiac risk factors include: Hypertension, hypertriglyceridemia, mild coronary artery calcification, former smoker, obesity (hx of gastric bypass).   Dyspnea on exertion Multifactorial. Echo February 2024: LVEF preserved, grade 1 diastolic impairment, right ventricular size and function normal, estimated RAP 3 mmHg GXT: At the workload performed stress ECG negative for ischemia.  Exercise time 3 minutes 57 seconds, achieved 5.78 METS, 84% of age-predicted maximum heart rate. Anemia: Currently on iron supplement, plans to have repeat labs with PCP next month Hypertension: Not well-controlled but improving.  Increase medical therapy as discussed below 20-year history of smoking: If ischemic workup is unremarkable may benefit from pulmonary evaluation/PFTs. Noncardiac causes to be deferred to PCP  Agatston coronary artery calcium score less than 100 Total CAC 25, 34th percentile Given her history of anemia and gastric bypass she would like to hold off on aspirin 81 mg p.o. daily. Most recent lipids noted LDL of 79 mg/dL. Working on lifestyle changes, started on  Boon, and plans to have repeat lipids with PCP next month.  She would like to hold off on statin/nonstatin therapy. Echo and GXT results noted above  Left ventricular hypertrophy Echocardiogram noted severe asymmetric left ventricular hypertrophy. I suspect that the degree of LVH is likely secondary to hypertensive heart disease but other etiologies cannot be entirely ruled out. Recommend cMRI stress test to evaluate for ischemia given her dyspnea, and suboptimal GXT.  In addition, the study will also quantify the degree of LVH and the presence of infiltrative disease and or hypertrophic cardiomyopathy. Further recommendations to follow. Check hemoglobin.  Benign hypertension Improving. Home blood pressures are not at goal. Discontinue losartan 25 mg p.o. daily. Start losartan 50 mg p.o. every afternoon. Start chlorthalidone 25 mg p.o. every morning Labs in 1 week to evaluate kidney function and electrolytes. Reemphasized importance of low-salt diet  Hypertriglyceridemia Lipids from December 2023 noted triglyceride levels to be 313 mg/dL Patient would like to hold off on pharmacological therapy. Will have it rechecked with PCP Currently managed by primary care provider.  Former smoker Quit in 2010. History of 20-year pack history of smoking. Reemphasized importance of complete smoking cessation.  FINAL MEDICATION LIST END OF ENCOUNTER: Meds ordered this encounter  Medications   chlorthalidone (HYGROTON) 25 MG tablet    Sig: Take 1 tablet (25 mg total) by mouth every morning.    Dispense:  30 tablet    Refill:  0   losartan (COZAAR) 50 MG tablet    Sig: Take 1 tablet (50 mg total) by mouth daily at 10 pm.    Dispense:  30 tablet    Refill:  0     Current Outpatient Medications:    albuterol (VENTOLIN HFA) 108 (90 Base) MCG/ACT inhaler, Inhale 2 puffs into the lungs every 4 (four) hours as needed for shortness of breath., Disp: , Rfl:    Ascorbic Acid (VITAMIN C ER PO),  Take  by mouth., Disp: , Rfl:    Calcium 200 MG TABS, Take by mouth., Disp: , Rfl:    chlorthalidone (HYGROTON) 25 MG tablet, Take 1 tablet (25 mg total) by mouth every morning., Disp: 30 tablet, Rfl: 0   Ferrous Sulfate (FER-IRON PO), Take by mouth., Disp: , Rfl:    Multiple Vitamins-Minerals (HAIR SKIN AND NAILS FORMULA PO), Take by mouth., Disp: , Rfl:    Multiple Vitamins-Minerals (MULTIVITAMIN WITH MINERALS) tablet, Take 1 tablet by mouth daily., Disp: , Rfl:    oxymetazoline (AFRIN) 0.05 % nasal spray, Place 1 spray into both nostrils 2 (two) times daily as needed for congestion., Disp: , Rfl:    tirzepatide (MOUNJARO) 5 MG/0.5ML Pen, Inject 5 mg into the skin once a week., Disp: , Rfl:    VITAMIN D, CHOLECALCIFEROL, PO, Take by mouth., Disp: , Rfl:    losartan (COZAAR) 50 MG tablet, Take 1 tablet (50 mg total) by mouth daily at 10 pm., Disp: 30 tablet, Rfl: 0  Orders Placed This Encounter  Procedures   MR CARDIAC STRESS TEST   Hemoglobin and hematocrit, blood   Basic metabolic panel    There are no Patient Instructions on file for this visit.   --Continue cardiac medications as reconciled in final medication list. --Return in about 3 months (around 05/07/2023) for Follow up, Dyspnea, Review test results. or sooner if needed. --Continue follow-up with your primary care physician regarding the management of your other chronic comorbid conditions.  Patient's questions and concerns were addressed to her satisfaction. She voices understanding of the instructions provided during this encounter.   This note was created using a voice recognition software as a result there may be grammatical errors inadvertently enclosed that do not reflect the nature of this encounter. Every attempt is made to correct such errors.  Tessa Lerner, [place], [facility]  Pager:  [phone] Office: [phone]

## 2023-02-14 ENCOUNTER — Encounter: Payer: Self-pay | Admitting: Cardiology

## 2023-02-14 DIAGNOSIS — I10 Essential (primary) hypertension: Secondary | ICD-10-CM

## 2023-02-14 DIAGNOSIS — R0609 Other forms of dyspnea: Secondary | ICD-10-CM

## 2023-02-14 NOTE — Telephone Encounter (Signed)
From patient.

## 2023-02-15 NOTE — Telephone Encounter (Signed)
From pt

## 2023-02-16 LAB — BASIC METABOLIC PANEL
BUN/Creatinine Ratio: 25 — ABNORMAL HIGH (ref 9–23)
BUN: 25 mg/dL — ABNORMAL HIGH (ref 6–24)
CO2: 21 mmol/L (ref 20–29)
Calcium: 10.3 mg/dL — ABNORMAL HIGH (ref 8.7–10.2)
Chloride: 99 mmol/L (ref 96–106)
Creatinine, Ser: 0.99 mg/dL (ref 0.57–1.00)
Glucose: 99 mg/dL (ref 70–99)
Potassium: 4.4 mmol/L (ref 3.5–5.2)
Sodium: 137 mmol/L (ref 134–144)
eGFR: 66 mL/min/{1.73_m2} (ref >59)

## 2023-02-16 LAB — HEMOGLOBIN AND HEMATOCRIT, BLOOD
Hematocrit: 40.7 % (ref 34.0–46.6)
Hemoglobin: 14.2 g/dL (ref 11.1–15.9)

## 2023-02-16 MED ORDER — LOSARTAN POTASSIUM 50 MG PO TABS: 50.0000 mg | ORAL_TABLET | Freq: Every day | ORAL | 0 refills | Status: DC

## 2023-02-16 NOTE — Telephone Encounter (Signed)
Renal function and potassium is stable.  I will send in a script for losartan 50mg  po qday.   Dr. Odis Hollingshead

## 2023-02-18 ENCOUNTER — Other Ambulatory Visit: Payer: Self-pay

## 2023-02-18 DIAGNOSIS — R0609 Other forms of dyspnea: Secondary | ICD-10-CM

## 2023-02-18 DIAGNOSIS — I10 Essential (primary) hypertension: Secondary | ICD-10-CM

## 2023-02-18 MED ORDER — LOSARTAN POTASSIUM 50 MG PO TABS: 50.0000 mg | ORAL_TABLET | Freq: Every day | ORAL | 0 refills | Status: AC

## 2023-02-18 MED ORDER — LOSARTAN POTASSIUM 50 MG PO TABS: 50.0000 mg | ORAL_TABLET | Freq: Every day | ORAL | 0 refills | Status: DC

## 2023-02-18 NOTE — Telephone Encounter (Signed)
  Thank you. Do you know where it was sent? I requested above for it to be sent to [facility] on [address] [facility]).

## 2023-02-18 NOTE — Telephone Encounter (Signed)
Has this been sent?

## 2023-02-18 NOTE — Telephone Encounter (Signed)
Yes, I did over the weekend. You can double check.   Maresha Anastos Cokato, DO, [facility]

## 2023-03-12 ENCOUNTER — Ambulatory Visit: Payer: 59

## 2023-05-07 ENCOUNTER — Ambulatory Visit: Payer: 59 | Admitting: Cardiology

## 2024-08-21 ENCOUNTER — Other Ambulatory Visit: Payer: Self-pay | Admitting: Gastroenterology

## 2024-09-29 ENCOUNTER — Ambulatory Visit (HOSPITAL_COMMUNITY): Admit: 2024-09-29 | Admitting: Gastroenterology

## 2024-09-29 ENCOUNTER — Encounter (HOSPITAL_COMMUNITY): Payer: Self-pay

## 2024-09-29 SURGERY — EGD (ESOPHAGOGASTRODUODENOSCOPY)
Anesthesia: Monitor Anesthesia Care

## 2024-10-27 ENCOUNTER — Other Ambulatory Visit: Payer: Self-pay | Admitting: Family Medicine

## 2024-10-27 DIAGNOSIS — Z87891 Personal history of nicotine dependence: Secondary | ICD-10-CM

## 2024-11-05 ENCOUNTER — Ambulatory Visit
Admission: RE | Admit: 2024-11-05 | Discharge: 2024-11-05 | Disposition: A | Source: Ambulatory Visit | Attending: Family Medicine | Admitting: Family Medicine

## 2024-11-05 ENCOUNTER — Other Ambulatory Visit: Payer: Self-pay | Admitting: Family Medicine

## 2024-11-05 DIAGNOSIS — R1031 Right lower quadrant pain: Secondary | ICD-10-CM

## 2024-11-05 DIAGNOSIS — Z87891 Personal history of nicotine dependence: Secondary | ICD-10-CM

## 2024-11-06 ENCOUNTER — Encounter: Payer: Self-pay | Admitting: Radiology

## 2024-11-10 ENCOUNTER — Other Ambulatory Visit: Payer: Self-pay | Admitting: Family Medicine

## 2024-11-10 ENCOUNTER — Ambulatory Visit
Admission: RE | Admit: 2024-11-10 | Discharge: 2024-11-10 | Disposition: A | Source: Ambulatory Visit | Attending: Family Medicine | Admitting: Family Medicine

## 2024-11-10 DIAGNOSIS — R1031 Right lower quadrant pain: Secondary | ICD-10-CM
